# Patient Record
Sex: Male | Born: 1960 | Race: Black or African American | Hispanic: No | Marital: Married | Smoking: Never smoker
Health system: Southern US, Community
[De-identification: ages and names within clinical notes are randomized; demographics above are authoritative.]

## PROBLEM LIST (undated history)

## (undated) DIAGNOSIS — I10 Essential (primary) hypertension: Secondary | ICD-10-CM

## (undated) DIAGNOSIS — M1990 Unspecified osteoarthritis, unspecified site: Secondary | ICD-10-CM

## (undated) HISTORY — PX: MOUTH SURGERY: SHX715

## (undated) HISTORY — DX: Essential (primary) hypertension: I10

---

## 1997-08-10 ENCOUNTER — Emergency Department (HOSPITAL_COMMUNITY): Admission: EM | Admit: 1997-08-10 | Discharge: 1997-08-10 | Payer: Self-pay | Admitting: Emergency Medicine

## 1999-08-05 ENCOUNTER — Encounter: Admission: RE | Admit: 1999-08-05 | Discharge: 1999-08-05 | Payer: Self-pay | Admitting: Family Medicine

## 1999-09-24 ENCOUNTER — Encounter: Admission: RE | Admit: 1999-09-24 | Discharge: 1999-09-24 | Payer: Self-pay | Admitting: Family Medicine

## 2003-04-04 ENCOUNTER — Encounter: Admission: RE | Admit: 2003-04-04 | Discharge: 2003-04-04 | Payer: Self-pay | Admitting: Family Medicine

## 2005-09-29 ENCOUNTER — Emergency Department (HOSPITAL_COMMUNITY): Admission: EM | Admit: 2005-09-29 | Discharge: 2005-09-29 | Payer: Self-pay | Admitting: Family Medicine

## 2006-06-06 ENCOUNTER — Emergency Department (HOSPITAL_COMMUNITY): Admission: EM | Admit: 2006-06-06 | Discharge: 2006-06-06 | Payer: Self-pay | Admitting: Family Medicine

## 2006-09-21 ENCOUNTER — Ambulatory Visit: Payer: Self-pay | Admitting: Internal Medicine

## 2006-09-21 ENCOUNTER — Ambulatory Visit: Payer: Self-pay | Admitting: *Deleted

## 2006-09-21 LAB — CONVERTED CEMR LAB
ALT: 21 units/L (ref 0–53)
Albumin: 4.7 g/dL (ref 3.5–5.2)
CO2: 26 meq/L (ref 19–32)
Calcium: 9.6 mg/dL (ref 8.4–10.5)
Chloride: 104 meq/L (ref 96–112)
Eosinophils Relative: 3 % (ref 0–5)
Glucose, Bld: 88 mg/dL (ref 70–99)
HCT: 47.2 % (ref 39.0–52.0)
Helicobacter Pylori Antibody-IgG: 3.5 — ABNORMAL HIGH
Hemoglobin: 16.2 g/dL (ref 13.0–17.0)
Lymphocytes Relative: 49 % — ABNORMAL HIGH (ref 12–46)
Lymphs Abs: 2.6 10*3/uL (ref 0.7–3.3)
Neutro Abs: 2 10*3/uL (ref 1.7–7.7)
Platelets: 309 10*3/uL (ref 150–400)
Sodium: 143 meq/L (ref 135–145)
Total Bilirubin: 1.1 mg/dL (ref 0.3–1.2)
Total Protein: 8.1 g/dL (ref 6.0–8.3)
WBC: 5.4 10*3/uL (ref 4.0–10.5)

## 2007-03-08 ENCOUNTER — Ambulatory Visit: Payer: Self-pay | Admitting: Internal Medicine

## 2007-04-04 ENCOUNTER — Ambulatory Visit: Payer: Self-pay | Admitting: Internal Medicine

## 2007-04-04 LAB — CONVERTED CEMR LAB
Cholesterol: 139 mg/dL (ref 0–200)
Total CHOL/HDL Ratio: 4.2

## 2007-04-10 ENCOUNTER — Ambulatory Visit: Payer: Self-pay | Admitting: Internal Medicine

## 2007-05-08 ENCOUNTER — Ambulatory Visit: Payer: Self-pay | Admitting: Internal Medicine

## 2007-08-22 ENCOUNTER — Ambulatory Visit: Payer: Self-pay | Admitting: Family Medicine

## 2007-08-23 ENCOUNTER — Ambulatory Visit (HOSPITAL_COMMUNITY): Admission: RE | Admit: 2007-08-23 | Discharge: 2007-08-23 | Payer: Self-pay | Admitting: Family Medicine

## 2008-04-01 ENCOUNTER — Emergency Department (HOSPITAL_COMMUNITY): Admission: EM | Admit: 2008-04-01 | Discharge: 2008-04-01 | Payer: Self-pay | Admitting: Family Medicine

## 2008-10-29 ENCOUNTER — Emergency Department (HOSPITAL_COMMUNITY): Admission: EM | Admit: 2008-10-29 | Discharge: 2008-10-29 | Payer: Self-pay | Admitting: Emergency Medicine

## 2009-02-13 ENCOUNTER — Encounter (INDEPENDENT_AMBULATORY_CARE_PROVIDER_SITE_OTHER): Payer: Self-pay | Admitting: Adult Health

## 2009-02-13 ENCOUNTER — Ambulatory Visit: Payer: Self-pay | Admitting: Internal Medicine

## 2009-02-13 LAB — CONVERTED CEMR LAB
Leukocytes, UA: NEGATIVE
Nitrite: NEGATIVE
PSA: 1.11 ng/mL (ref 0.10–4.00)
Specific Gravity, Urine: 1.024 (ref 1.005–1.030)
Urobilinogen, UA: 0.2 (ref 0.0–1.0)
pH: 5.5 (ref 5.0–8.0)

## 2009-10-21 ENCOUNTER — Emergency Department (HOSPITAL_COMMUNITY): Admission: EM | Admit: 2009-10-21 | Discharge: 2009-10-21 | Payer: Self-pay | Admitting: Family Medicine

## 2010-05-20 ENCOUNTER — Ambulatory Visit (HOSPITAL_COMMUNITY)
Admission: RE | Admit: 2010-05-20 | Discharge: 2010-05-20 | Disposition: A | Discharge status: Home / Self Care | Payer: Self-pay | Source: Ambulatory Visit | Attending: Gastroenterology | Admitting: Gastroenterology

## 2010-05-20 ENCOUNTER — Other Ambulatory Visit: Payer: Self-pay | Admitting: Gastroenterology

## 2010-05-20 DIAGNOSIS — R197 Diarrhea, unspecified: Secondary | ICD-10-CM | POA: Insufficient documentation

## 2010-05-20 DIAGNOSIS — K644 Residual hemorrhoidal skin tags: Secondary | ICD-10-CM | POA: Insufficient documentation

## 2010-05-20 DIAGNOSIS — D126 Benign neoplasm of colon, unspecified: Secondary | ICD-10-CM | POA: Insufficient documentation

## 2010-10-22 LAB — POCT URINALYSIS DIP (DEVICE)
Bilirubin Urine: NEGATIVE
Glucose, UA: NEGATIVE
Operator id: 235561
Urobilinogen, UA: 0.2

## 2010-12-11 ENCOUNTER — Emergency Department (HOSPITAL_COMMUNITY): Payer: No Typology Code available for payment source

## 2010-12-11 ENCOUNTER — Emergency Department (HOSPITAL_COMMUNITY)
Admission: EM | Admit: 2010-12-11 | Discharge: 2010-12-12 | Disposition: A | Discharge status: Home / Self Care | Payer: No Typology Code available for payment source | Attending: Emergency Medicine | Admitting: Emergency Medicine

## 2010-12-11 ENCOUNTER — Encounter: Payer: Self-pay | Admitting: *Deleted

## 2010-12-11 DIAGNOSIS — IMO0001 Reserved for inherently not codable concepts without codable children: Secondary | ICD-10-CM | POA: Insufficient documentation

## 2010-12-11 DIAGNOSIS — IMO0002 Reserved for concepts with insufficient information to code with codable children: Secondary | ICD-10-CM

## 2010-12-11 DIAGNOSIS — M2550 Pain in unspecified joint: Secondary | ICD-10-CM | POA: Insufficient documentation

## 2010-12-11 DIAGNOSIS — R109 Unspecified abdominal pain: Secondary | ICD-10-CM | POA: Insufficient documentation

## 2010-12-11 DIAGNOSIS — M545 Low back pain, unspecified: Secondary | ICD-10-CM | POA: Insufficient documentation

## 2010-12-11 DIAGNOSIS — M79609 Pain in unspecified limb: Secondary | ICD-10-CM | POA: Insufficient documentation

## 2010-12-11 DIAGNOSIS — S61409A Unspecified open wound of unspecified hand, initial encounter: Secondary | ICD-10-CM | POA: Insufficient documentation

## 2010-12-11 DIAGNOSIS — Y92410 Unspecified street and highway as the place of occurrence of the external cause: Secondary | ICD-10-CM | POA: Insufficient documentation

## 2010-12-11 LAB — POCT I-STAT, CHEM 8
BUN: 10 mg/dL (ref 6–23)
Calcium, Ion: 1.07 mmol/L — ABNORMAL LOW (ref 1.12–1.32)
Creatinine, Ser: 0.9 mg/dL (ref 0.50–1.35)
Sodium: 140 mEq/L (ref 135–145)
TCO2: 25 mmol/L (ref 0–100)

## 2010-12-11 MED ORDER — TETANUS-DIPHTH-ACELL PERTUSSIS 5-2.5-18.5 LF-MCG/0.5 IM SUSP
0.5000 mL | Freq: Once | INTRAMUSCULAR | Status: AC
  Administered 2010-12-11: 0.5 mL via INTRAMUSCULAR
  Filled 2010-12-11: qty 0.5

## 2010-12-11 MED ORDER — FENTANYL CITRATE 0.05 MG/ML IJ SOLN
50.0000 ug | Freq: Once | INTRAMUSCULAR | Status: AC
  Administered 2010-12-11: 50 ug via INTRAVENOUS
  Filled 2010-12-11: qty 2
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
## 2010-12-12 ENCOUNTER — Emergency Department (HOSPITAL_COMMUNITY): Payer: No Typology Code available for payment source

## 2010-12-12 MED ORDER — IBUPROFEN 800 MG PO TABS: 800.0000 mg | ORAL_TABLET | Freq: Three times a day (TID) | ORAL | Status: AC

## 2010-12-12 MED ORDER — IOHEXOL 300 MG/ML  SOLN
100.0000 mL | Freq: Once | INTRAMUSCULAR | Status: AC | PRN
  Administered 2010-12-12: 100 mL via INTRAVENOUS

## 2010-12-12 NOTE — ED Notes (Signed)
D/c instructions reviewed w/ pt - pt denies any further questions or concerns at present.   

## 2010-12-12 NOTE — ED Provider Notes (Signed)
LACERATION REPAIR Performed by: Rise Patience Consent: Verbal consent obtained. Risks and benefits: risks, benefits and alternatives were discussed Patient identity confirmed: provided demographic data Time out performed prior to procedure Prepped and Draped in normal sterile fashion Wound explored  Laceration Location: left dorsal hand, 5th MCP  Laceration Length: 1.5cm  No Foreign Bodies seen or palpated  Anesthesia: local infiltration  Local anesthetic: lidocaine 2% no epinephrine  Irrigation method: syringe Amount of cleaning: standard  Skin closure: 4-0 prolene Number of sutures or staples: 3  Technique: simple interrupted.  Patient tolerance: Patient tolerated the procedure well with no immediate complications.   Matthew Stark, [place] 12/12/10 [phone]

## 2010-12-12 NOTE — ED Provider Notes (Signed)
Medical screening examination/treatment/procedure(s) were performed by non-physician practitioner and as supervising physician I was immediately available for consultation/collaboration.   Abe Schools, MD 12/12/10 1552 

## 2010-12-22 ENCOUNTER — Encounter (HOSPITAL_COMMUNITY): Payer: Self-pay | Admitting: Emergency Medicine

## 2010-12-22 ENCOUNTER — Emergency Department (INDEPENDENT_AMBULATORY_CARE_PROVIDER_SITE_OTHER)
Admission: EM | Admit: 2010-12-22 | Discharge: 2010-12-22 | Disposition: A | Discharge status: Home / Self Care | Payer: Self-pay | Source: Home / Self Care | Attending: Family Medicine | Admitting: Family Medicine

## 2010-12-22 DIAGNOSIS — Z4802 Encounter for removal of sutures: Secondary | ICD-10-CM

## 2010-12-22 NOTE — ED Notes (Signed)
Suture removal from left hand/little finger.  Placed in mced on 12/11/2010

## 2010-12-22 NOTE — ED Provider Notes (Signed)
History     CSN: [id number] Arrival date & time: 12/22/2010  3:01 PM   First MD Initiated Contact with Patient 12/22/10 1508      Chief Complaint  Patient presents with  . Suture / Staple Removal    (Consider location/radiation/quality/duration/timing/severity/associated sxs/prior treatment) Patient is a 49 y.o. male presenting with suture removal. The history is provided by the patient.  Suture / Staple Removal  The sutures were placed 7 to 10 days ago. There has been no treatment since the wound repair. There has been no drainage from the wound. There is no redness present. There is no swelling present. The pain has no pain. He has no difficulty moving the affected extremity or digit.    History reviewed. No pertinent past medical history.  History reviewed. No pertinent past surgical history.  No family history on file.  History  Substance Use Topics  . Smoking status: Never Smoker   . Smokeless tobacco: Not on file  . Alcohol Use: No      Review of Systems  Constitutional: Negative.   Skin: Positive for wound.    Allergies  Review of patient's allergies indicates no known allergies.  Home Medications   Current Outpatient Rx  Name Route Sig Dispense Refill  . IBUPROFEN 800 MG PO TABS Oral Take 1 tablet (800 mg total) by mouth 3 (three) times daily. 21 tablet 0    BP 156/81  Pulse 70  Temp(Src) 98.7 F (37.1 C) (Oral)  Resp 18  SpO2 100%  Physical Exam  Nursing note and vitals reviewed. Constitutional: He appears well-developed and well-nourished.  Skin: Skin is warm and dry.       ED Course  Procedures (including critical care time)  Labs Reviewed - No data to display No results found.   1. Encounter for removal of sutures       MDM  3 sutures removed.        Barkley Bruns, MD 12/22/10 [phone]

## 2011-06-16 ENCOUNTER — Encounter (HOSPITAL_COMMUNITY): Payer: Self-pay | Admitting: *Deleted

## 2011-06-16 ENCOUNTER — Emergency Department (HOSPITAL_COMMUNITY)
Admission: EM | Admit: 2011-06-16 | Discharge: 2011-06-16 | Disposition: A | Discharge status: Home / Self Care | Payer: Self-pay | Source: Home / Self Care | Attending: Emergency Medicine | Admitting: Emergency Medicine

## 2011-06-16 DIAGNOSIS — L0390 Cellulitis, unspecified: Secondary | ICD-10-CM

## 2011-06-16 DIAGNOSIS — T148XXA Other injury of unspecified body region, initial encounter: Secondary | ICD-10-CM

## 2011-06-16 DIAGNOSIS — W57XXXA Bitten or stung by nonvenomous insect and other nonvenomous arthropods, initial encounter: Secondary | ICD-10-CM

## 2011-06-16 MED ORDER — DOXYCYCLINE HYCLATE 100 MG PO TABS: 100.0000 mg | ORAL_TABLET | Freq: Two times a day (BID) | ORAL | Status: AC

## 2011-06-16 MED ORDER — MUPIROCIN 2 % EX OINT: TOPICAL_OINTMENT | Freq: Three times a day (TID) | CUTANEOUS | Status: AC

## 2011-06-16 NOTE — Discharge Instructions (Signed)
Wood Tick Bite Ticks are insects that attach themselves to the skin. Most tick bites are harmless, but sometimes ticks carry diseases that can make a person quite ill. The chance of getting ill depends on:  The kind of tick that bites you.   Time of year.   How long the tick is attached.   Geographic location.  Wood ticks are also called dog ticks. They are generally black. They can have white markings. They live in shrubs and grassy areas. They are larger than deer ticks. Wood ticks are about the size of a watermelon seed. They have a hard body. The most common places for ticks to attach themselves are the scalp, neck, armpits, waist, and groin. Wood ticks may stay attached for up to 2 weeks. TICKS MUST BE REMOVED AS SOON AS POSSIBLE TO HELP PREVENT DISEASES CAUSED BY TICK BITES.  TO REMOVE A TICK: 1. If available, put on latex gloves before trying to remove a tick.  2. Grasp the tick as close to the skin as possible, with curved forceps, fine tweezers or a special tick removal tool.  3. Pull gently with steady pressure until the tick lets go. Do not twist the tick or jerk it suddenly. This may break off the tick's head or mouth parts.  4. Do not crush the tick's body. This could force disease-carrying fluids from the tick into your body.  5. After the tick is removed, wash the bite area and your hands with soap and water or other disinfectant.  6. Apply a small amount of antiseptic cream or ointment to the bite site.  7. Wash and disinfect any instruments that were used.  8. Save the tick in a jar or plastic bag for later identification. Preserve the tick with a bit of alcohol or put it in the freezer.  9. Do not apply a hot match, petroleum jelly, or fingernail polish to the tick. This does not work and may increase the chances of disease from the tick bite.  YOU MAY NEED TO SEE YOUR CAREGIVER FOR A TETANUS SHOT NOW IF:  You have no idea when you had the last one.   You have never had a  tetanus shot before.  If you need a tetanus shot, and you decide not to get one, there is a rare chance of getting tetanus. Sickness from tetanus can be serious. If you get a tetanus shot, your arm may swell, get red and warm to the touch at the shot site. This is common and not a problem. PREVENTION  Wear protective clothing. Long sleeves and pants are best.   Wear white clothes to see ticks more easily   Tuck your pant legs into your socks.   If walking on trail, stay in the middle of the trail to avoid brushing against bushes.   Put insect repellent on all exposed skin and along boot tops, pant legs and sleeve cuffs   Check clothing, hair and skin repeatedly and before coming inside.   Brush off any ticks that are not attached.  SEEK MEDICAL CARE IF:   You cannot remove a tick or part of the tick that is left in the skin.   Unexplained fever.   Redness and swelling in the area of the tick bite.   Tender, swollen lymph glands.   Diarrhea.   Weight loss.   Cough.   Fatigue.   Muscle, joint or bone pain.   Belly pain.   Headache.   Rash.    SEEK IMMEDIATE MEDICAL CARE IF:   You develop an oral temperature above 102 F (38.9 C).   You are having trouble walking or moving your legs.   Numbness in the legs.   Shortness of breath.   Confusion.   Repeated vomiting.  Document Released: 12/19/1999 Document Revised: 12/10/2010 Document Reviewed: 11/27/2007 [employer] Patient Information 2012 [employer]. 

## 2011-06-16 NOTE — ED Notes (Signed)
Pt reports a painful, reddened circular area, approx 4mm, on his right inguinal area.  He noticed this 4 days ago

## 2011-06-16 NOTE — ED Provider Notes (Signed)
Chief Complaint  Patient presents with  . Cellulitis    History of Present Illness:   The patient is a 51 year old [place] male who noted a bump in his right groin 4 days ago and this has had some surrounding erythema, swelling, and pain to palpation since that. He denies any fever, chills, headaches, muscle aches, or skin rash elsewhere.  Review of Systems:  Other than noted above, the patient denies any of the following symptoms: Systemic:  No fever, chills, sweats, weight loss, or fatigue. ENT:  No nasal congestion, rhinorrhea, sore throat, swelling of lips, tongue or throat. Resp:  No cough, wheezing, or shortness of breath. Skin:  No rash, itching, nodules, or suspicious lesions.  PMFSH:  Past medical history, family history, social history, meds, and allergies were reviewed.  Physical Exam:   Vital signs:  BP 130/79  Pulse 86  Temp 98.2 F (36.8 C) (Oral)  Resp 18  SpO2 98% Gen:  Alert, oriented, in no distress. ENT:  Pharynx clear, no intraoral lesions, moist mucous membranes. Lungs:  Clear to auscultation. Skin:  He has an engorged tick attached to his right groin area with a 6 cm area of surrounding erythema, heat, induration, and tenderness to palpation. His skin was otherwise clear.  Procedure Note:  Verbal informed consent was obtained from the patient.  Risks and benefits were outlined with the patient.  Patient understands and accepts these risks.  Identity of the patient was confirmed verbally and by armband.    Procedure was performed as followed:  The tick in the area surrounding it was prepped with alcohol, 1 cc of 2% Xylocaine without epinephrine was injected around the site of the tick bite, and the tick was removed with gentle traction. The tick appeared to be intact, and there were no obvious retained mouth parts.  Patient tolerated the procedure well without any immediate complications.   Assessment:  The primary encounter diagnosis was Tick bite. A diagnosis  of Cellulitis was also pertinent to this visit.  Plan:   1.  The following meds were prescribed:   New Prescriptions   DOXYCYCLINE (VIBRA-TABS) 100 MG TABLET    Take 1 tablet (100 mg total) by mouth 2 (two) times daily.   MUPIROCIN OINTMENT (BACTROBAN) 2 %    Apply topically 3 (three) times daily.   2.  The patient was instructed in symptomatic care and handouts were given. 3.  The patient was told to return if becoming worse in any way, if no better in 3 or 4 days, and given some red flag symptoms that would indicate earlier return. The patient was instructed in wound care and will be on the lookout for any symptoms of systemic illness, especially with fever, chills, headache, photophobia, myalgias, or generalized rash.     Reuben Likes, MD 06/16/11 1320
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
## 2013-01-17 ENCOUNTER — Emergency Department (HOSPITAL_COMMUNITY)
Admission: EM | Admit: 2013-01-17 | Discharge: 2013-01-17 | Disposition: A | Discharge status: Home / Self Care | Payer: Medicaid Other | Source: Home / Self Care | Attending: Emergency Medicine | Admitting: Emergency Medicine

## 2013-01-17 ENCOUNTER — Encounter (HOSPITAL_COMMUNITY): Payer: Self-pay | Admitting: Emergency Medicine

## 2013-01-17 DIAGNOSIS — Z0000 Encounter for general adult medical examination without abnormal findings: Secondary | ICD-10-CM

## 2013-01-17 LAB — POCT URINALYSIS DIP (DEVICE)
Bilirubin Urine: NEGATIVE
GLUCOSE, UA: NEGATIVE mg/dL
KETONES UR: NEGATIVE mg/dL
LEUKOCYTES UA: NEGATIVE
Nitrite: NEGATIVE
PROTEIN: NEGATIVE mg/dL
UROBILINOGEN UA: 0.2 mg/dL (ref 0.0–1.0)
pH: 6 (ref 5.0–8.0)

## 2013-01-17 NOTE — ED Provider Notes (Signed)
Medical screening examination/treatment/procedure(s) were performed by non-physician practitioner and as supervising physician I was immediately available for consultation/collaboration.  Philipp Deputy, M.D.  Harden Mo, MD 01/17/13 2100

## 2013-01-17 NOTE — ED Notes (Signed)
C/o prostates check since he is having pain for two weeks

## 2013-01-17 NOTE — ED Provider Notes (Signed)
CSN: [id number]     Arrival date & time 01/17/13  1641 History   First MD Initiated Contact with Patient 01/17/13 1837     Chief Complaint  Patient presents with  . Prostate Check   (Consider location/radiation/quality/duration/timing/severity/associated sxs/prior Treatment) HPI Comments: Patient presents requesting screening for prostate cancer. He reports that he did contact his PCP ([place])  regarding this issue and was given an appointment for mid-February 2015. He decided he did not want to wait this long and then chose to come to the UC for evaluation. Denies difficulty with penile discharge, dysuria, hematuria, genital lesions, genital or testicular swelling, back pain, frequent nighttime urination, difficulty emptying bladder, flank pain or abdominal pain.   The history is provided by the patient. The history is limited by a language barrier. No language interpreter was used.    History reviewed. No pertinent past medical history. History reviewed. No pertinent past surgical history. History reviewed. No pertinent family history. History  Substance Use Topics  . Smoking status: Never Smoker   . Smokeless tobacco: Not on file  . Alcohol Use: No    Review of Systems  All other systems reviewed and are negative.    Allergies  Review of patient's allergies indicates no known allergies.  Home Medications  No current outpatient prescriptions on file. BP 139/79  Pulse 73  Temp(Src) 98.4 F (36.9 C) (Oral)  Resp 16  SpO2 96% Physical Exam  Nursing note and vitals reviewed. Constitutional: He is oriented to person, place, and time. He appears well-developed and well-nourished.  HENT:  Head: Normocephalic and atraumatic.  Eyes: Conjunctivae are normal. No scleral icterus.  Neck: Normal range of motion. Neck supple.  Cardiovascular: Normal rate, regular rhythm and normal heart sounds.   Pulmonary/Chest: Effort normal and breath sounds normal. No respiratory  distress.  Abdominal: Soft. Bowel sounds are normal. He exhibits no distension and no mass. There is no tenderness. There is no guarding. Hernia confirmed negative in the right inguinal area and confirmed negative in the left inguinal area.  Genitourinary: Rectum normal, prostate normal, testes normal and penis normal. Rectal exam shows no external hemorrhoid, no internal hemorrhoid, no mass and no tenderness. Prostate is not enlarged and not tender. Right testis shows no mass, no swelling and no tenderness. Left testis shows no mass, no swelling and no tenderness. Circumcised.  Musculoskeletal: Normal range of motion.  Lymphadenopathy:       Right: No inguinal adenopathy present.       Left: No inguinal adenopathy present.  Neurological: He is alert and oriented to person, place, and time.  Skin: Skin is warm and dry.  Psychiatric: He has a normal mood and affect. His behavior is normal.    ED Course  Procedures (including critical care time) Labs Review Labs Reviewed - No data to display Imaging Review No results found.  EKG Interpretation    Date/Time:    Ventricular Rate:    PR Interval:    QRS Duration:   QT Interval:    QTC Calculation:   R Axis:     Text Interpretation:              MDM  Advised patient that should he wish to be evaluated for prostate cancer, which seems to be his main concern tonight, this would involve both a visit with his PCP and possibly a urologist as well. Encouraged him to keep his Feb. 2015 appointment with his PCP to discuss. UA normal. Exam unremarkable.  [place], [place] 01/17/13 [phone]

## 2013-01-17 NOTE — Discharge Instructions (Signed)
Normal Exam, Adult You were seen and examined today in our facility. Our caregiver found nothing wrong on the exam. If testing was done such as lab work or x-rays, they did not indicate enough wrong to suggest that treatment should be given. The caregiver then must decide after testing is finished if your concern is a physical problem or illness that needs treatment. Today no treatable problem was found. Even if reassurance was given, if you feel you are getting worse when you get home make sure you call back or return to our department. For the protection of your privacy, test results can not be given over the phone. Make sure you receive the results of your test. Ask as to how these results are to be obtained if you have not been informed. It is your responsibility to obtain your test results. Your condition can change over time. Sometimes it takes more than one visit to determine the cause of your symptoms. It is important that you monitor your condition for any changes. SEEK IMMEDIATE MEDICAL CARE IF:  You develop an oral temperature above 102 F (38.9 C), which lasts for more than 2 days, not controlled by medications. Only take over-the-counter or prescription medicines for pain, discomfort, or fever as directed by your caregiver.  You develop a loss of appetite or start throwing up (vomiting).  You develop a rash, cough, belly (abdominal) pain, earache, headache, or develop pain in neck, muscles, or joints.  The problem or problems which brought you to our facility become worse or are a cause of more concern. If we have told you today your exam and tests are normal, and a short while later you feel this is not right, please seek medical attention so you may be rechecked. Document Released: 04/04/2000 Document Revised: 03/15/2011 Document Reviewed: 07/28/2007 [facility] Patient Information 2014 [place], [place].   Your urine studies were normal.should you wish to be evaluated for prostate  cancer, which seems to be your main concern tonight, this would involve both a visit with your doctor at [facility] and possibly a urologist as well. Please keep your Feb. 2015 appointment with your doctor to discuss.

## 2014-05-17 ENCOUNTER — Emergency Department (HOSPITAL_COMMUNITY)
Admission: EM | Admit: 2014-05-17 | Discharge: 2014-05-17 | Disposition: A | Discharge status: Home / Self Care | Payer: Medicaid Other | Source: Home / Self Care | Attending: Family Medicine | Admitting: Family Medicine

## 2014-05-17 ENCOUNTER — Encounter (HOSPITAL_COMMUNITY): Payer: Self-pay | Admitting: Emergency Medicine

## 2014-05-17 DIAGNOSIS — N451 Epididymitis: Secondary | ICD-10-CM

## 2014-05-17 LAB — POCT URINALYSIS DIP (DEVICE)
Bilirubin Urine: NEGATIVE
GLUCOSE, UA: NEGATIVE mg/dL
KETONES UR: NEGATIVE mg/dL
Leukocytes, UA: NEGATIVE
Nitrite: NEGATIVE
PROTEIN: NEGATIVE mg/dL
SPECIFIC GRAVITY, URINE: 1.02 (ref 1.005–1.030)
Urobilinogen, UA: 0.2 mg/dL (ref 0.0–1.0)
pH: 7 (ref 5.0–8.0)

## 2014-05-17 MED ORDER — DOXYCYCLINE HYCLATE 100 MG PO CAPS: 100.0000 mg | ORAL_CAPSULE | Freq: Two times a day (BID) | ORAL | Status: DC

## 2014-05-17 NOTE — ED Notes (Signed)
C/o intermittent groin pain onset 5/6; believes it maybe prostate Denies urinary sx, abd pain, fevers, chills Alert, no signs of acute distress.

## 2014-05-17 NOTE — ED Provider Notes (Signed)
CSN: [id number]     Arrival date & time 05/17/14  1435 History   First MD Initiated Contact with Patient 05/17/14 1521     Chief Complaint  Patient presents with  . Groin Pain   (Consider location/radiation/quality/duration/timing/severity/associated sxs/prior Treatment) HPI Comments: 54 year old male is complaining of a pinching type pain in the perineum. It is primarily exacerbated or experienced when walking with the act of sitting down or standing up. He does not notice it as much when at rest. He denies dysuria, urinary frequency or urgency. In the remote past he remembers having something similar and had a small amount of blood in his urine. At that time he was treated with an unknown type medication and he got better.   History reviewed. No pertinent past medical history. History reviewed. No pertinent past surgical history. No family history on file. History  Substance Use Topics  . Smoking status: Never Smoker   . Smokeless tobacco: Not on file  . Alcohol Use: No    Review of Systems  Constitutional: Negative.   Respiratory: Negative.   Genitourinary: Negative for dysuria, urgency, frequency, hematuria, flank pain, decreased urine volume, discharge, penile swelling, scrotal swelling, genital sores and penile pain.  Skin: Negative.   Neurological: Negative.   Psychiatric/Behavioral: Negative.     Allergies  Review of patient's allergies indicates no known allergies.  Home Medications   Prior to Admission medications   Medication Sig Start Date End Date Taking? Authorizing Provider  doxycycline (VIBRAMYCIN) 100 MG capsule Take 1 capsule (100 mg total) by mouth 2 (two) times daily. 05/17/14   Janne Napoleon, NP   BP 134/80 mmHg  Pulse 75  Temp(Src) 97.9 F (36.6 C) (Oral)  Resp 16  SpO2 95% Physical Exam  Constitutional: He appears well-developed and well-nourished. No distress.  Pulmonary/Chest: Effort normal. No respiratory distress.  Genitourinary: Rectum normal  and penis normal. No penile tenderness.  Normal external male genitalia. Bilateral testicles are descended. There are normal size shape and position. No tenderness. Palpation of the epididymal apparatus reveals no pain or tenderness. Palpation of the perineum just posterior to the scrotum reveals minimal discomfort.  DRE: Normal sphincter tone. No external or internal or external lesions. Prostate is firm and smooth and not enlarged and nontender. No stool in the rectal vault. No other lesions palpated.  Neurological: He is alert. He exhibits normal muscle tone.  Skin: Skin is warm and dry.  Psychiatric: He has a normal mood and affect.  Nursing note and vitals reviewed.   ED Course  Procedures (including critical care time) Labs Review Labs Reviewed  POCT URINALYSIS DIP (DEVICE) - Abnormal; Notable for the following:    Hgb urine dipstick TRACE (*)    All other components within normal limits   Results for orders placed or performed during the hospital encounter of 05/17/14  POCT urinalysis dip (device)  Result Value Ref Range   Glucose, UA NEGATIVE NEGATIVE mg/dL   Bilirubin Urine NEGATIVE NEGATIVE   Ketones, ur NEGATIVE NEGATIVE mg/dL   Specific Gravity, Urine 1.020 1.005 - 1.030   Hgb urine dipstick TRACE (A) NEGATIVE   pH 7.0 5.0 - 8.0   Protein, ur NEGATIVE NEGATIVE mg/dL   Urobilinogen, UA 0.2 0.0 - 1.0 mg/dL   Nitrite NEGATIVE NEGATIVE   Leukocytes, UA NEGATIVE NEGATIVE    Imaging Review No results found.   MDM   1. Epididymitis    Doxy 100 bid F/U with Merlyn Albert, NP 05/17/14 1544

## 2014-05-17 NOTE — Discharge Instructions (Signed)
Possible Epididymitis Epididymitis is a swelling (inflammation) of the epididymis. The epididymis is a cord-like structure along the back part of the testicle. Epididymitis is usually, but not always, caused by infection. This is usually a sudden problem beginning with chills, fever and pain behind the scrotum and in the testicle. There may be swelling and redness of the testicle. DIAGNOSIS  Physical examination will reveal a tender, swollen epididymis. Sometimes, cultures are obtained from the urine or from prostate secretions to help find out if there is an infection or if the cause is a different problem. Sometimes, blood work is performed to see if your white blood cell count is elevated and if a germ (bacterial) or viral infection is present. Using this knowledge, an appropriate medicine which kills germs (antibiotic) can be chosen by your caregiver. A viral infection causing epididymitis will most often go away (resolve) without treatment. HOME CARE INSTRUCTIONS   Hot sitz baths for 20 minutes, 4 times per day, may help relieve pain.  Only take over-the-counter or prescription medicines for pain, discomfort or fever as directed by your caregiver.  Take all medicines, including antibiotics, as directed. Take the antibiotics for the full prescribed length of time even if you are feeling better.  It is very important to keep all follow-up appointments. SEEK IMMEDIATE MEDICAL CARE IF:   You have a fever.  You have pain not relieved with medicines.  You have any worsening of your problems.  Your pain seems to come and go.  You develop pain, redness, and swelling in the scrotum and surrounding areas. MAKE SURE YOU:   Understand these instructions.  Will watch your condition.  Will get help right away if you are not doing well or get worse. Document Released: 12/19/1999 Document Revised: 03/15/2011 Document Reviewed: 11/07/2008 [facility] Patient Information 2015 [place], [place]. This  information is not intended to replace advice given to you by your health care provider. Make sure you discuss any questions you have with your health care provider.

## 2018-08-14 ENCOUNTER — Other Ambulatory Visit: Payer: Self-pay

## 2018-08-14 DIAGNOSIS — Z20822 Contact with and (suspected) exposure to covid-19: Secondary | ICD-10-CM

## 2020-10-17 ENCOUNTER — Ambulatory Visit: Payer: Medicaid Other | Attending: Orthopedic Surgery | Admitting: Physical Therapy

## 2020-10-17 ENCOUNTER — Encounter: Payer: Self-pay | Admitting: Physical Therapy

## 2020-10-17 ENCOUNTER — Other Ambulatory Visit: Payer: Self-pay

## 2020-10-17 DIAGNOSIS — G8929 Other chronic pain: Secondary | ICD-10-CM | POA: Diagnosis present

## 2020-10-17 DIAGNOSIS — R2689 Other abnormalities of gait and mobility: Secondary | ICD-10-CM | POA: Diagnosis present

## 2020-10-17 DIAGNOSIS — M25561 Pain in right knee: Secondary | ICD-10-CM | POA: Diagnosis not present

## 2020-10-17 DIAGNOSIS — M25661 Stiffness of right knee, not elsewhere classified: Secondary | ICD-10-CM

## 2020-10-17 DIAGNOSIS — M6281 Muscle weakness (generalized): Secondary | ICD-10-CM | POA: Diagnosis present

## 2020-10-17 NOTE — Patient Instructions (Signed)
Access Code: [email or web address] URL: [email or web address] Date: 10/17/2020 Prepared by: Hilda Blades  Exercises Supine Quad Set - 1 x daily - 7 x weekly - 2 sets - 10 reps - 5 hold Active Straight Leg Raise with Quad Set - 1 x daily - 7 x weekly - 2 sets - 10 reps Supine Bridge - 1 x daily - 7 x weekly - 2 sets - 10 reps - 5 hold Sidelying Hip Abduction - 1 x daily - 7 x weekly - 2 sets - 10 reps Seated Hamstring Stretch - 2 x daily - 7 x weekly - 3 reps - 30 seconds hold Standing Gastroc Stretch at Counter - 2 x daily - 7 x weekly - 3 reps - 30 seconds hold

## 2020-10-17 NOTE — Therapy (Signed)
[place], [place], [address] Phone: [phone]   Fax:  [phone]  Physical Therapy Evaluation  Patient Details  Name: Matthew Stark MRN: [id number] Date of Birth: [birth year] Referring Provider (PT): Willaim Sheng, MD   Encounter Date: 10/17/2020   PT End of Session - 10/17/20 0823     Visit Number 1    Number of Visits 8    Date for PT Re-Evaluation 12/12/20    Authorization Type [facility] [employer]    PT Start Time 0825    PT Stop Time 0910    PT Time Calculation (min) 45 min    Activity Tolerance Patient tolerated treatment well    Behavior During Therapy [facility] for tasks assessed/performed             History reviewed. No pertinent past medical history.  History reviewed. No pertinent surgical history.  There were no vitals filed for this visit.    Subjective Assessment - 10/17/20 0825     Subjective Patient reports pain in right knee. Has been going on for a long time, can't remember a specific incident but has been getting slowly worse. He rpeorts that he has trouble walking and squatting. Pain is mainly located to the front and inside of the knee. Patient also reports trouble kneeling on the floor. He has a two story home and has to go up and down the stairs slowly.    Limitations Lifting;Standing;Walking;House hold activities;Sitting    How long can you sit comfortably? "only hurts when getting up after sitting long periods"    How long can you walk comfortably? "all walking is painful"    Diagnostic tests X-ray and MRI - not available    Patient Stated Goals Pain relief and improve walking    Currently in Pain? Yes    Pain Score 0-No pain   states 10/10 while walking   Pain Location Knee    Pain Orientation Right    Pain Descriptors / Indicators Aching;Sore    Pain Type Chronic pain    Pain Onset More than a month ago    Pain Frequency Intermittent    Aggravating Factors   Walking, standing, squatting, kneeling    Pain Relieving Factors Rest, sitting, medication                [facility] PT Assessment - 10/17/20 0001       Assessment   Medical Diagnosis Right Knee OA    Referring Provider (PT) Willaim Sheng, MD    Onset Date/Surgical Date --   ongoing 1+ years   Prior Therapy No      Precautions   Precautions None      Restrictions   Weight Bearing Restrictions No      Balance Screen   Has the patient fallen in the past 6 months No    Has the patient had a decrease in activity level because of a fear of falling?  No    Is the patient reluctant to leave their home because of a fear of falling?  No      Home Environment   Living Environment Private residence    Type of [place] Access Level entry    Home Layout Two level    Alternate Level Stairs-Number of Steps 1 flght of stairs      Prior Function   Level of Independence Independent    Vocation Full time employment  Vocation Requirements Sometimes standing and sometimes sitting down    Leisure None reported      Cognition   Overall Cognitive Status Within Functional Limits for tasks assessed      Observation/Other Assessments   Observations Patient appears in no apparent distress    Focus on Therapeutic Outcomes (FOTO)  NA - [facility]      Sensation   Light Touch Appears Intact      Coordination   Gross Motor Movements are Fluid and Coordinated Yes      Functional Tests   Functional tests Squat      Squat   Comments Decreased depth with weight shift toward left      ROM / Strength   AROM / PROM / Strength AROM;Strength      AROM   AROM Assessment Site Knee    Right/Left Knee Right;Left    Right Knee Extension -4    Right Knee Flexion 136    Left Knee Extension 0    Left Knee Flexion 139      Strength   Strength Assessment Site Hip;Knee    Right/Left Hip Right;Left    Right Hip Flexion 4/5    Right Hip Extension 4-/5    Right Hip ABduction 4-/5     Left Hip Flexion 4/5    Left Hip Extension 4/5    Left Hip ABduction 4/5    Right/Left Knee Right;Left    Right Knee Flexion 4/5    Right Knee Extension 4+/5    Left Knee Flexion 5/5    Left Knee Extension 5/5      Flexibility   Soft Tissue Assessment /Muscle Length yes    Hamstrings Limited bilat    Quadriceps WFL bilat      Palpation   Patella mobility Slightly limited on right    Palpation comment TTP medial and lateral joint line      Ambulation/Gait   Ambulation/Gait Yes    Ambulation/Gait Assistance 7: Independent    Gait Comments Decreased knee extension on right, antalgic on right                        Objective measurements completed on examination: See above findings.       Accident Adult PT Treatment/Exercise - 10/17/20 0001       Exercises   Exercises Knee/Hip      Knee/Hip Exercises: Stretches   Passive Hamstring Stretch 2 reps;30 seconds    Passive Hamstring Stretch Limitations seated    Gastroc Stretch 2 reps;30 seconds    Gastroc Stretch Limitations standing at counter      Knee/Hip Exercises: Supine   Quad Sets 10 reps   5 sec hold   Bridges 10 reps    Straight Leg Raises 10 reps      Knee/Hip Exercises: Sidelying   Hip ABduction 10 reps                     PT Education - 10/17/20 1610     Education Details Exam findings, POC, HEP    Person(s) Educated Patient    Methods Explanation;Demonstration;Tactile cues;Verbal cues;Handout    Comprehension Verbalized understanding;Returned demonstration;Verbal cues required;Tactile cues required;Need further instruction              PT Short Term Goals - 10/17/20 0903       PT SHORT TERM GOAL #1   Title Patient will be I with initial HEP to progress with  PT    Baseline provided at eval    Time 4    Period Weeks    Status New    Target Date 11/14/20      PT SHORT TERM GOAL #2   Title Patient will demo right knee extension to 0 deg in order to improve walking     Baseline lacking 4 deg    Time 4    Period Weeks    Status New    Target Date 11/14/20      PT SHORT TERM GOAL #3   Title Patient will report </= 7/10 pain with walking to reduce functional limitations    Baseline reports 10/10 pain with walking    Time 4    Period Weeks    Status New    Target Date 11/14/20               PT Long Term Goals - 10/17/20 0905       PT LONG TERM GOAL #1   Title Patient will be I with final HEP to maintain progress from PT    Baseline provided at eval    Time 8    Period Weeks    Status New    Target Date 12/12/20      PT LONG TERM GOAL #2   Title Patient will demonstrate 5/5 right knee strength to reduce pain with walking and stairs    Baseline knee extension 4+/5, knee flexion 4/5 MMT at eval    Time 8    Period Weeks    Status New    Target Date 12/12/20      PT LONG TERM GOAL #3   Title Patient will report </= 4/10 pain with walking to improve community access and shopping ability    Baseline 10/10 pain at eval    Time 8    Period Weeks    Status New    Target Date 12/12/20      PT LONG TERM GOAL #4   Title Patient will demonstrate >/= 4/5 MMT right hip strength to improve stair negotiation and squatting ability    Baseline hip strength grossly 4-/5 MMT on right    Time 8    Period Weeks    Status New    Target Date 12/12/20                    Plan - 10/17/20 0908     Clinical Impression Statement Patient presents to PT with report of chronic right knee pain that has been worsening since onset with no apparent mechanism of injury. Patient exhibits a limitation with right knee active motion with inability to achieve neutral knee extension, decreased quad and hip strength of the right side, hamstring/calf tightness on right compared to left, deviations with gait and difficulty walking and stair negotiation. Patient was provided exercises to initiate stretching and strengthening for the right knee and he would benefit  from continued skilled PT to progress his mobility and strength in order to reduce pain and maximize functiona ability with tasks such as walking, kneeling, stairs, and squatting.    Personal Factors and Comorbidities Fitness;Time since onset of injury/illness/exacerbation    Examination-Activity Limitations Locomotion Level;Sit;Squat;Stairs;Stand;Lift    Examination-Participation Restrictions Meal Prep;Cleaning;Occupation;Community Activity;Shop;Yard Work    Stability/Clinical Decision Making Stable/Uncomplicated    Clinical Decision Making Low    Rehab Potential Good    PT Frequency 1x / week    PT Duration 8 weeks  PT Treatment/Interventions ADLs/Self Care Home Management;Aquatic Therapy;Cryotherapy;Electrical Stimulation;Iontophoresis 4mg /ml Dexamethasone;Moist Heat;Traction;Ultrasound;Neuromuscular re-education;Balance training;Therapeutic exercise;Therapeutic activities;Functional mobility training;Stair training;Gait training;Patient/family education;Dry needling;Passive range of motion;Taping;Vasopneumatic Device;Spinal Manipulations;Joint Manipulations    PT Next Visit Plan Review HEP and progress PRN, patellar and knee mobs to improve mobility, stretching for hamstring, hip and knee strengthening progressing to closed chain    PT Home Exercise Plan [email or web address]    Consulted and Agree with Plan of Care Patient             Patient will benefit from skilled therapeutic intervention in order to improve the following deficits and impairments:  Abnormal gait, Decreased range of motion, Difficulty walking, Decreased activity tolerance, Pain, Decreased strength, Improper body mechanics, Impaired flexibility, Decreased balance  Visit Diagnosis: Chronic pain of right knee  Stiffness of right knee, not elsewhere classified  Other abnormalities of gait and mobility  Muscle weakness (generalized)     Problem List There are no problems to display for this patient.   Hilda Blades,  PT, DPT, LAT, ATC 10/17/20  12:18 PM Phone: [phone] Fax: [facility] [address] [place], [place], [address] Phone: [phone]   Fax:  [phone]  Name: Matthew Stark MRN: [id number] Date of Birth: 03/09/1960   Check all possible CPT codes: 22633- Therapeutic Exercise, [phone]- Neuro Re-education, [phone] - Gait Training, [phone] - Manual Therapy, [phone] - Therapeutic Activities, and [id number] - Welcome

## 2020-10-22 ENCOUNTER — Encounter: Payer: Self-pay | Admitting: Physical Therapy

## 2020-10-22 ENCOUNTER — Other Ambulatory Visit: Payer: Self-pay

## 2020-10-22 ENCOUNTER — Ambulatory Visit: Payer: Medicaid Other | Admitting: Physical Therapy

## 2020-10-22 DIAGNOSIS — M6281 Muscle weakness (generalized): Secondary | ICD-10-CM

## 2020-10-22 DIAGNOSIS — M25661 Stiffness of right knee, not elsewhere classified: Secondary | ICD-10-CM

## 2020-10-22 DIAGNOSIS — M25561 Pain in right knee: Secondary | ICD-10-CM | POA: Diagnosis not present

## 2020-10-22 DIAGNOSIS — R2689 Other abnormalities of gait and mobility: Secondary | ICD-10-CM

## 2020-10-22 DIAGNOSIS — G8929 Other chronic pain: Secondary | ICD-10-CM

## 2020-10-22 NOTE — Therapy (Signed)
[place], [place], [address] Phone: [phone]   Fax:  [phone]  Physical Therapy Treatment  Patient Details  Name: Matthew Stark MRN: [id number] Date of Birth: 1960/10/05 Referring Provider (PT): Willaim Sheng, MD   Encounter Date: 10/22/2020   PT End of Session - 10/22/20 1003     Visit Number 2    Number of Visits 8    Date for PT Re-Evaluation 12/12/20    Authorization Type [employer]    PT Start Time 1000    PT Stop Time 1043    PT Time Calculation (min) 43 min    Activity Tolerance Patient tolerated treatment well    Behavior During Therapy [facility] for tasks assessed/performed             History reviewed. No pertinent past medical history.  History reviewed. No pertinent surgical history.  There were no vitals filed for this visit.   Subjective Assessment - 10/22/20 1006     Subjective Pt reports that he is currently having not pain in his R knee.  He has some soreness in his low back he rates his low back pain as 8/10.  He has been completing his exercises at home. regularly.    Limitations Lifting;Standing;Walking;House hold activities;Sitting    How long can you sit comfortably? "only hurts when getting up after sitting long periods"    How long can you walk comfortably? "all walking is painful"    Diagnostic tests X-ray and MRI - not available    Patient Stated Goals Pain relief and improve walking    Pain Onset More than a month ago                [facility] PT Assessment - 10/22/20 0001       AROM   Right Knee Extension 4   lacking            [facility] Adult PT Treatment/Exercise:  Therapeutic Exercise: - nu-step L5 43m while taking subjective and planning session with patient - Quad set 20x with 5# weight on distal femur and heel propped - R HS stretch - contract - relax - contract - 3x - R hip flexor stretch - contract - relax - 3x - glute bridge -  2x10 - S/L hip abduction - 2x10 ea - slant board stretch - 45''x3 - step up - 6'' with concentration on knee ext - 10x - up with R, lower on L (unable to control eccentrically)  - knee ext machine - 5# - 2x10     PT Short Term Goals - 10/17/20 0903       PT SHORT TERM GOAL #1   Title Patient will be I with initial HEP to progress with PT    Baseline provided at eval    Time 4    Period Weeks    Status New    Target Date 11/14/20      PT SHORT TERM GOAL #2   Title Patient will demo right knee extension to 0 deg in order to improve walking    Baseline lacking 4 deg    Time 4    Period Weeks    Status New    Target Date 11/14/20      PT SHORT TERM GOAL #3   Title Patient will report </= 7/10 pain with walking to reduce functional limitations    Baseline reports 10/10 pain with walking    Time 4  Period Weeks    Status New    Target Date 11/14/20               PT Long Term Goals - 10/17/20 0905       PT LONG TERM GOAL #1   Title Patient will be I with final HEP to maintain progress from PT    Baseline provided at eval    Time 8    Period Weeks    Status New    Target Date 12/12/20      PT LONG TERM GOAL #2   Title Patient will demonstrate 5/5 right knee strength to reduce pain with walking and stairs    Baseline knee extension 4+/5, knee flexion 4/5 MMT at eval    Time 8    Period Weeks    Status New    Target Date 12/12/20      PT LONG TERM GOAL #3   Title Patient will report </= 4/10 pain with walking to improve community access and shopping ability    Baseline 10/10 pain at eval    Time 8    Period Weeks    Status New    Target Date 12/12/20      PT LONG TERM GOAL #4   Title Patient will demonstrate >/= 4/5 MMT right hip strength to improve stair negotiation and squatting ability    Baseline hip strength grossly 4-/5 MMT on right    Time 8    Period Weeks    Status New    Target Date 12/12/20                   Plan - 10/22/20  1033     Clinical Impression Statement Pt reports no increase in baseline pain following therapy  HEP was reviewed, but left unchanged    Overall, Matthew Stark is progressing well with therapy.  Today we concentrated on quad strengthening, hip strengthening, and knee range of motion.  Pt shows significant hip ext ROM deficit bil.  McMurray's (-) R knee.  Pt will continue to benefit from skilled physical therapy to address remaining deficits and achieve listed goals.  Continue per POC.    Personal Factors and Comorbidities Fitness;Time since onset of injury/illness/exacerbation    Examination-Activity Limitations Locomotion Level;Sit;Squat;Stairs;Stand;Lift    Examination-Participation Restrictions Meal Prep;Cleaning;Occupation;Community Activity;Shop;Yard Work    Stability/Clinical Decision Making Stable/Uncomplicated    Rehab Potential Good    PT Frequency 1x / week    PT Duration 8 weeks    PT Treatment/Interventions ADLs/Self Care Home Management;Aquatic Therapy;Cryotherapy;Electrical Stimulation;Iontophoresis 4mg /ml Dexamethasone;Moist Heat;Traction;Ultrasound;Neuromuscular re-education;Balance training;Therapeutic exercise;Therapeutic activities;Functional mobility training;Stair training;Gait training;Patient/family education;Dry needling;Passive range of motion;Taping;Vasopneumatic Device;Spinal Manipulations;Joint Manipulations    PT Next Visit Plan Review HEP and progress PRN, patellar and knee mobs to improve mobility, stretching for hamstring, hip and knee strengthening progressing to closed chain    PT Home Exercise Plan [id number]    Consulted and Agree with Plan of Care Patient             Patient will benefit from skilled therapeutic intervention in order to improve the following deficits and impairments:  Abnormal gait, Decreased range of motion, Difficulty walking, Decreased activity tolerance, Pain, Decreased strength, Improper body mechanics, Impaired flexibility,  Decreased balance  Visit Diagnosis: Chronic pain of right knee  Stiffness of right knee, not elsewhere classified  Other abnormalities of gait and mobility  Muscle weakness (generalized)     Problem List There are no problems to display  for this patient.   Mathis Dad, PT 10/22/2020, 10:39 AM  [facility] [address] [place], [place], [address] Phone: [phone]   Fax:  [phone]  Name: Matthew Stark MRN: [id number] Date of Birth: [birth year]

## 2020-11-05 ENCOUNTER — Encounter: Payer: Self-pay | Admitting: Physical Therapy

## 2020-11-05 ENCOUNTER — Ambulatory Visit: Payer: Medicaid Other | Attending: Orthopedic Surgery | Admitting: Physical Therapy

## 2020-11-05 ENCOUNTER — Other Ambulatory Visit: Payer: Self-pay

## 2020-11-05 DIAGNOSIS — G8929 Other chronic pain: Secondary | ICD-10-CM

## 2020-11-05 DIAGNOSIS — M25661 Stiffness of right knee, not elsewhere classified: Secondary | ICD-10-CM | POA: Diagnosis present

## 2020-11-05 DIAGNOSIS — R2689 Other abnormalities of gait and mobility: Secondary | ICD-10-CM | POA: Diagnosis present

## 2020-11-05 DIAGNOSIS — M25561 Pain in right knee: Secondary | ICD-10-CM | POA: Insufficient documentation

## 2020-11-05 DIAGNOSIS — M6281 Muscle weakness (generalized): Secondary | ICD-10-CM

## 2020-11-05 NOTE — Patient Instructions (Signed)
Access Code: [email or web address] URL: [email or web address] Date: 11/05/2020 Prepared by: Hilda Blades  Exercises Supine Quad Set - 1 x daily - 7 x weekly - 2 sets - 10 reps - 5 hold Active Straight Leg Raise with Quad Set - 1 x daily - 7 x weekly - 2 sets - 10 reps Supine Bridge - 1 x daily - 7 x weekly - 2 sets - 10 reps - 5 hold Sidelying Hip Abduction - 1 x daily - 7 x weekly - 2 sets - 10 reps Seated Hamstring Stretch - 2 x daily - 7 x weekly - 3 reps - 30 seconds hold Standing Gastroc Stretch at Counter - 2 x daily - 7 x weekly - 3 reps - 30 seconds hold Supine Quadriceps Stretch with Strap on Table - 2 x daily - 7 x weekly - 3 reps - 30 seconds hold Sit to Stand Without Arm Support - 1 x daily - 7 x weekly - 3 sets - 10 reps

## 2020-11-05 NOTE — Therapy (Signed)
[place], [place], [address] Phone: [phone]   Fax:  [phone]  Physical Therapy Treatment  Patient Details  Name: Matthew Stark MRN: [id number] Date of Birth: 12/13/1960 Referring Provider (PT): Willaim Sheng, MD   Encounter Date: 11/05/2020   PT End of Session - 11/05/20 0859     Visit Number 3    Number of Visits 8    Date for PT Re-Evaluation 12/12/20    Authorization Type [employer]    PT Start Time 0915    PT Stop Time 1000    PT Time Calculation (min) 45 min    Activity Tolerance Patient tolerated treatment well    Behavior During Therapy [facility] for tasks assessed/performed             History reviewed. No pertinent past medical history.  History reviewed. No pertinent surgical history.  There were no vitals filed for this visit.   Subjective Assessment - 11/05/20 0918     Subjective Patient reports he continues to feel better. States he continues to have pain in the right knee every day, especially when he is walking. He does note that bending the knee feels better.    Patient Stated Goals Pain relief and improve walking    Currently in Pain? Yes    Pain Score 9     Pain Location Knee    Pain Orientation Right    Pain Descriptors / Indicators Aching;Sore    Pain Type Chronic pain    Pain Onset More than a month ago    Pain Frequency Intermittent    Aggravating Factors  Walking                [facility] PT Assessment - 11/05/20 0001       AROM   Right Knee Extension 4   lacking     Ambulation/Gait   Gait Comments Antalgic on right                       [facility] Adult PT Treatment/Exercise:  Therapeutic Exercise: NuStep L6 x 5 min for knee motion and warm-up - while taking subjective Slant board calf stretch 3 x 30 sec Seated hamstring stretch 3 x 30 sec Modified thoma stretch PROM 3 x 30 sec Bridge 2 x 10 with 3 sec hold SLR 2 x 10  each Sidelying hip abduction 2 x 10 each Knee extension machine 10#  3 x 10 Leg press (omega): single leg 20# 2 x 10 each Sit<>stand x 10  Manual Therapy: N/A  Neuromuscular re-ed: N/A  Therapeutic Activity: N/A  Modalities: N/A  Self Care: N/A      Performed last visit 10/22/20: Therapeutic Exercise: - nu-step L5 73m while taking subjective and planning session with patient - Quad set 20x with 5# weight on distal femur and heel propped - R HS stretch - contract - relax - contract - 3x - R hip flexor stretch - contract - relax - 3x - glute bridge - 2x10 - S/L hip abduction - 2x10 ea - slant board stretch - 45''x3 - step up - 6'' with concentration on knee ext - 10x - up with R, lower on L (unable to control eccentrically)  - knee ext machine - 5# - 2x10        PT Education - 11/05/20 0857     Education Details HEP update    Person(s) Educated Patient  Methods Explanation;Demonstration;Verbal cues;Handout    Comprehension Verbalized understanding;Returned demonstration;Verbal cues required;Need further instruction              PT Short Term Goals - 11/05/20 0959       PT SHORT TERM GOAL #1   Title Patient will be I with initial HEP to progress with PT    Baseline progressing    Time 4    Period Weeks    Status On-going    Target Date 11/14/20      PT SHORT TERM GOAL #2   Title Patient will demo right knee extension to 0 deg in order to improve walking    Baseline lacking 4 deg    Time 4    Period Weeks    Status On-going    Target Date 11/14/20      PT SHORT TERM GOAL #3   Title Patient will report </= 7/10 pain with walking to reduce functional limitations    Baseline reports 9/10 pain with walking    Time 4    Period Weeks    Status On-going    Target Date 11/14/20               PT Long Term Goals - 10/17/20 0905       PT LONG TERM GOAL #1   Title Patient will be I with final HEP to maintain progress from PT    Baseline  provided at eval    Time 8    Period Weeks    Status New    Target Date 12/12/20      PT LONG TERM GOAL #2   Title Patient will demonstrate 5/5 right knee strength to reduce pain with walking and stairs    Baseline knee extension 4+/5, knee flexion 4/5 MMT at eval    Time 8    Period Weeks    Status New    Target Date 12/12/20      PT LONG TERM GOAL #3   Title Patient will report </= 4/10 pain with walking to improve community access and shopping ability    Baseline 10/10 pain at eval    Time 8    Period Weeks    Status New    Target Date 12/12/20      PT LONG TERM GOAL #4   Title Patient will demonstrate >/= 4/5 MMT right hip strength to improve stair negotiation and squatting ability    Baseline hip strength grossly 4-/5 MMT on right    Time 8    Period Weeks    Status New    Target Date 12/12/20                   Plan - 11/05/20 0920     Clinical Impression Statement Patient tolerated therapy well with no adverse effects. Continued therapy focus on improve knee motion and progressing his strength. He continues to demonstrate limitation in knee and hip extension on the right but is tolerating progressions in strengthening exercises without an increase in his knee pain. Updated HEP to progress hip flexibility and LE strength. Patient would benefit from continued skilled PT to progress his mobility and strength in order to reduce pain and maximize functiona ability with tasks such as walking, kneeling, stairs, and squatting.    PT Treatment/Interventions ADLs/Self Care Home Management;Aquatic Therapy;Cryotherapy;Electrical Stimulation;Iontophoresis 4mg /ml Dexamethasone;Moist Heat;Traction;Ultrasound;Neuromuscular re-education;Balance training;Therapeutic exercise;Therapeutic activities;Functional mobility training;Stair training;Gait training;Patient/family education;Dry needling;Passive range of motion;Taping;Vasopneumatic Device;Spinal Manipulations;Joint Manipulations     PT  Next Visit Plan Review HEP and progress PRN, patellar and knee mobs to improve mobility, stretching for hamstring, hip and knee strengthening progressing to closed chain    PT Home Exercise Plan [id number]    Consulted and Agree with Plan of Care Patient             Patient will benefit from skilled therapeutic intervention in order to improve the following deficits and impairments:  Abnormal gait, Decreased range of motion, Difficulty walking, Decreased activity tolerance, Pain, Decreased strength, Improper body mechanics, Impaired flexibility, Decreased balance  Visit Diagnosis: Chronic pain of right knee  Stiffness of right knee, not elsewhere classified  Other abnormalities of gait and mobility  Muscle weakness (generalized)     Problem List There are no problems to display for this patient.   Hilda Blades, PT, DPT, LAT, ATC 11/05/20  10:01 AM Phone: [phone] Fax: [facility] [address] [place], [place], [address] Phone: [phone]   Fax:  [phone]  Name: Matthew Stark MRN: [id number] Date of Birth: 03/22/1960

## 2020-11-12 ENCOUNTER — Ambulatory Visit: Payer: Medicaid Other | Admitting: Physical Therapy

## 2020-11-12 ENCOUNTER — Telehealth: Payer: Self-pay | Admitting: Physical Therapy

## 2020-11-12 NOTE — Telephone Encounter (Signed)
Called and informed patient of missed visit and provided reminder of next appt and attendance policy.  

## 2020-11-19 ENCOUNTER — Other Ambulatory Visit: Payer: Self-pay

## 2020-11-19 ENCOUNTER — Encounter: Payer: Self-pay | Admitting: Physical Therapy

## 2020-11-19 ENCOUNTER — Ambulatory Visit: Payer: Medicaid Other | Admitting: Physical Therapy

## 2020-11-19 DIAGNOSIS — M25561 Pain in right knee: Secondary | ICD-10-CM

## 2020-11-19 DIAGNOSIS — M25661 Stiffness of right knee, not elsewhere classified: Secondary | ICD-10-CM

## 2020-11-19 DIAGNOSIS — R2689 Other abnormalities of gait and mobility: Secondary | ICD-10-CM

## 2020-11-19 DIAGNOSIS — M6281 Muscle weakness (generalized): Secondary | ICD-10-CM

## 2020-11-19 DIAGNOSIS — G8929 Other chronic pain: Secondary | ICD-10-CM

## 2020-11-19 NOTE — Therapy (Signed)
[place], [place], [address] Phone: [phone]   Fax:  [phone]  Physical Therapy Treatment / Discharge  Patient Details  Name: Matthew Stark MRN: [id number] Date of Birth: 12-13-1960 Referring Provider (PT): Willaim Sheng, MD   Encounter Date: 11/19/2020   PT End of Session - 11/19/20 0928     Visit Number 4    Number of Visits 8    Date for PT Re-Evaluation 12/12/20    Authorization Type [employer]    PT Start Time 0914    PT Stop Time 1000    PT Time Calculation (min) 46 min    Activity Tolerance Patient tolerated treatment well    Behavior During Therapy [facility] for tasks assessed/performed             History reviewed. No pertinent past medical history.  History reviewed. No pertinent surgical history.  There were no vitals filed for this visit.   Subjective Assessment - 11/19/20 0916     Subjective Patient reports he is having a lot of pain in the right knee. States it is bad. He is unable to walk long periods and putting any weight on the knee causes severe pain.    Limitations Lifting;Standing;Walking;House hold activities;Sitting    How long can you walk comfortably? "all walking is painful"    Patient Stated Goals Pain relief and improve walking    Currently in Pain? Yes    Pain Score 9     Pain Location Knee    Pain Orientation Right    Pain Descriptors / Indicators Aching;Sore    Pain Type Chronic pain    Pain Onset More than a month ago    Pain Frequency Constant    Aggravating Factors  Walking                [facility] PT Assessment - 11/19/20 0001       Assessment   Medical Diagnosis Right Knee OA    Referring Provider (PT) Willaim Sheng, MD      Precautions   Precautions None      Restrictions   Weight Bearing Restrictions No      Balance Screen   Has the patient fallen in the past 6 months No      Prior Function   Level of  Independence Independent      Observation/Other Assessments   Observations Patient appears in no apparent distress    Focus on Therapeutic Outcomes (FOTO)  NA - MCD      AROM   Right Knee Extension 5   lacking   Right Knee Flexion 135      Strength   Right Hip Extension 4-/5    Right Hip ABduction 4-/5    Left Hip Extension 4/5    Left Hip ABduction 4/5    Right Knee Flexion 4/5    Right Knee Extension 4/5   limited secondary to pain   Left Knee Flexion 5/5    Left Knee Extension 5/5      Flexibility   Hamstrings Limited bilat    Quadriceps Hip flexor limited bilat      Ambulation/Gait   Ambulation/Gait Yes    Ambulation/Gait Assistance 7: Independent    Gait Comments Varus alignment, significant antalgia on right                  [facility] Adult PT Treatment/Exercise:  Therapeutic Exercise: NuStep L5 x 5 min  with LE while taking subjective Quad set with towel roll under knee 2 x 10 with 5 sec hold SLR 2 x 10 Bridge 2 x 10 Sidelying hip abduction 2 x 10 each LAQ with red 2 x 10 Seated hamstring curl with red 2 x 10 Seated hamstring stretch 2 x 30 sec each Sit to stand 2 x 10 Standing TKE with green 2 x 10 Standing calf stretch at counter 2 x 30 sec Modified thomas stretch x 30 sec  Manual Therapy: N/A  Neuromuscular re-ed: N/A  Therapeutic Activity: N/A  Modalities: N/A  Self Care: N/A                   PT Education - 11/19/20 0927     Education Details Discharge from PT due to lack of progress, follow-up with referring provider, HEP    Person(s) Educated Patient    Methods Explanation;Demonstration;Handout    Comprehension Verbalized understanding;Returned demonstration              PT Short Term Goals - 11/19/20 0931       PT SHORT TERM GOAL #1   Title Patient will be I with initial HEP to progress with PT    Baseline patient I with HEP    Time 4    Period Weeks    Status Achieved    Target Date 11/14/20       PT SHORT TERM GOAL #2   Title Patient will demo right knee extension to 0 deg in order to improve walking    Baseline lacking 5 deg    Time 4    Period Weeks    Status Not Met    Target Date 11/14/20      PT SHORT TERM GOAL #3   Title Patient will report </= 7/10 pain with walking to reduce functional limitations    Baseline reports 9/10 pain with walking    Time 4    Period Weeks    Status Not Met    Target Date 11/14/20               PT Long Term Goals - 11/19/20 0931       PT LONG TERM GOAL #1   Title Patient will be I with final HEP to maintain progress from PT    Baseline patient I with the HEP    Time 8    Period Weeks    Status Achieved      PT LONG TERM GOAL #2   Title Patient will demonstrate 5/5 right knee strength to reduce pain with walking and stairs    Baseline knee strength grossly 4/5 MMT, limited secondary to pain    Time 8    Period Weeks    Status Not Met      PT LONG TERM GOAL #3   Title Patient will report </= 4/10 pain with walking to improve community access and shopping ability    Baseline 9/10 pain at eval    Time 8    Period Weeks    Status Not Met      PT LONG TERM GOAL #4   Title Patient will demonstrate >/= 4/5 MMT right hip strength to improve stair negotiation and squatting ability    Baseline hip strength grossly 4-/5 MMT on right    Time 8    Period Weeks    Status Not Met  Plan - 11/19/20 0929     Clinical Impression Statement Patient tolerated therapy well with no adverse effects. Patient has demonstrated no improvement in therapy and actually demonstrates slight regression in knee strength secondary to pain level. Patient provided finalized HEP and he demonstrates independence with those exercises. He was advised to follow-up with his referring provider and that he would neeed a new referral to PT if he would like to return for therapy. Patient will be discharged from PT due to lack of progress and  persistent high levels of pain.    Personal Factors and Comorbidities Fitness;Time since onset of injury/illness/exacerbation    Examination-Activity Limitations Locomotion Level;Sit;Squat;Stairs;Stand;Lift    Examination-Participation Restrictions Meal Prep;Cleaning;Occupation;Community Activity;Shop;Yard Work    PT Treatment/Interventions ADLs/Self Care Home Management;Aquatic Therapy;Cryotherapy;Electrical Stimulation;Iontophoresis 65m/ml Dexamethasone;Moist Heat;Traction;Ultrasound;Neuromuscular re-education;Balance training;Therapeutic exercise;Therapeutic activities;Functional mobility training;Stair training;Gait training;Patient/family education;Dry needling;Passive range of motion;Taping;Vasopneumatic Device;Spinal Manipulations;Joint Manipulations    PT Next Visit Plan NA - discharge    PT Home Exercise Plan [id number]   Consulted and Agree with Plan of Care Patient             Patient will benefit from skilled therapeutic intervention in order to improve the following deficits and impairments:  Abnormal gait, Decreased range of motion, Difficulty walking, Decreased activity tolerance, Pain, Decreased strength, Improper body mechanics, Impaired flexibility, Decreased balance  Visit Diagnosis: Chronic pain of right knee  Stiffness of right knee, not elsewhere classified  Other abnormalities of gait and mobility  Muscle weakness (generalized)     Problem List There are no problems to display for this patient.   CHilda Blades PT, DPT, LAT, ATC 11/19/20  10:03 AM Phone: [phone]: [facility] [address]Phone: [phone]  Fax:  [phone] Name: Matthew HYAMSMRN: [id number] of Birth: [birth year]  PHYSICAL THERAPY DISCHARGE SUMMARY  Visits from Start of Care: 4  Current functional level related to goals / functional outcomes: See above    Remaining deficits: See above   Education / Equipment: HEP   Patient agrees to discharge. Patient goals were not met. Patient is being discharged due to lack of progress.

## 2020-11-19 NOTE — Patient Instructions (Signed)
Access Code: [email or web address] URL: [email or web address] Date: 11/19/2020 Prepared by: Hilda Blades  Exercises Supine Quad Set - 1 x daily - 7 x weekly - 3 sets - 10 reps - 5 hold Active Straight Leg Raise with Quad Set - 1 x daily - 7 x weekly - 3 sets - 10 reps Supine Bridge - 1 x daily - 7 x weekly - 3 sets - 10 reps - 5 hold Sidelying Hip Abduction - 1 x daily - 7 x weekly - 3 sets - 10 reps Seated Knee Extension with Anchored Resistance - 1 x daily - 7 x weekly - 3 sets - 10 reps Seated Knee Flexion with Anchored Resistance - 1 x daily - 7 x weekly - 3 sets - 10 reps Sit to Stand Without Arm Support - 1 x daily - 7 x weekly - 3 sets - 10 reps Standing Terminal Knee Extension with Resistance - 1 x daily - 7 x weekly - 3 sets - 10 reps Seated Hamstring Stretch - 2 x daily - 7 x weekly - 3 reps - 30 seconds hold Standing Gastroc Stretch at Counter - 2 x daily - 7 x weekly - 3 reps - 30 seconds hold Modified Thomas Stretch - 2 x daily - 7 x weekly - 3 reps - 30 seconds hold

## 2021-01-24 ENCOUNTER — Other Ambulatory Visit: Payer: Self-pay

## 2021-01-24 ENCOUNTER — Ambulatory Visit: Payer: Medicaid Other | Attending: Orthopedic Surgery

## 2021-01-24 DIAGNOSIS — M6281 Muscle weakness (generalized): Secondary | ICD-10-CM | POA: Insufficient documentation

## 2021-01-24 DIAGNOSIS — M25561 Pain in right knee: Secondary | ICD-10-CM | POA: Insufficient documentation

## 2021-01-24 DIAGNOSIS — G8929 Other chronic pain: Secondary | ICD-10-CM | POA: Diagnosis present

## 2021-01-24 DIAGNOSIS — R2689 Other abnormalities of gait and mobility: Secondary | ICD-10-CM | POA: Insufficient documentation

## 2021-01-24 NOTE — Therapy (Signed)
OUTPATIENT PHYSICAL THERAPY LOWER EXTREMITY EVALUATION   Patient Name: Matthew Stark MRN: [id number] DOB:1960-09-24, 61 y.o., male Today's Date: 01/24/2021   PT End of Session - 01/24/21 1117     Visit Number 1    Number of Visits 17    Date for PT Re-Evaluation 03/21/21    Authorization Type [employer]    Authorization Time Period Pending Auth    PT Start Time 1115    PT Stop Time 1200    PT Time Calculation (min) 45 min    Activity Tolerance Patient tolerated treatment well    Behavior During Therapy WFL for tasks assessed/performed             Past Medical History:  Diagnosis Date   Hypertension    History reviewed. No pertinent surgical history. There are no problems to display for this patient.   PCP: No primary care provider on file.  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 He states he has had a lot of swelling and pain during this time. He also reports daily severe morning stiffness. He adds that he can also having popping/ crackling in his knee when bending it. He adds that he can have Rt LE numbness in his entire leg after prolonged walking. He reports receiving two steroid injections, which have helped for about 5 months each. He reports receiving recent X-rays of his Rt knee, which are not in the patients chart. He states that the X-rays showed significant arthritis. He is currently scheduled for a Rt TKA on 03/02/2021 and is partaking in PT for pre-hab purposes. Red flag screening negative. He is also scheduled for colon surgery to remove pre-cancerous polyps 6 months following his knee  replacement.  PERTINENT HISTORY: Scheduled Rt knee TKA 03/02/2021  PAIN:  Are you having pain? Yes NPRS scale: 10/10 Pain location: Rt knee PAIN TYPE: sharp Aggravating factors: Walking > 100 meters Relieving factors: Cold weather  PRECAUTIONS: None  WEIGHT BEARING RESTRICTIONS No  FALLS:  Has patient fallen in last 6 months? No, Number of falls: 0  LIVING ENVIRONMENT: Lives with: lives with their family Lives in: House/apartment Stairs: Yes; Internal: 15 steps; on left going up Has following equipment at home: None  OCCUPATION: Applied for disability, unemployed  PLOF: [place] Return to walking, exercise, perform yard work   OBJECTIVE:   DIAGNOSTIC FINDINGS: None related to current problem   COGNITION:  Overall cognitive status: Within functional limits for tasks assessed     SENSATION:  Light touch: Appears intact  MUSCLE LENGTH: Hamstring 90/90 test: Right 55 deg; Left 40 deg  POSTURE:  WNL  PALPATION: TTP to medial and lateral border of Rt patella, no joint line tenderness  PASSIVE ACCESSORIES: Painful and hypomobile Rt patellar mobilizations in all planes on Rt  LE AROM/PROM:  A/PROM Right 01/24/2021 Left 01/24/2021  Knee flexion 125/127p! 140/145  Knee extension -12/ -8p! 0/ 3   (Blank rows = not tested)  LE MMT:  MMT Right 01/24/2021 Left 01/24/2021  Hip flexion 4+/5 5/5  Hip extension 3+/5 4/5  Hip abduction 4/5 4/5  Knee flexion 4+/5 5/5  Knee extension 3+/5 with pain 5/5   (Blank rows = not tested)   FUNCTIONAL TESTS:  Squat: 75% limited with pain Lunge: 75% limited with pain on Rt, 50% limited on Lt SLS: WNL on Lt, step error after 5 seconds on Rt  GAIT: Distance walked: 30 ft Assistive device utilized: None Level of assistance: Complete Independence Comments: Antalgic gait pattern with decreased Rt stance time, decreased terminal knee extension, decreased pelvic rotation    TODAY'S TREATMENT: [facility]  Adult PT Treatment:                                                DATE: 01/27/2021 Therapeutic Exercise: Seated active hamstring stretch on Rt x1 minute Supine bridge x10 with 3-sec hold Sidelying hip abduction x10 with 3-sec hold BIL Mini squat x10 Seated LAQ x10 with 3-sec hold on Rt Manual Therapy: N/A Neuromuscular re-ed: N/A Therapeutic Activity: N/A Modalities: N/A Self Care: N/A    PATIENT EDUCATION:  Education details: Educated on POC, prognosis, and HEP Person educated: Patient Education method: Consulting civil engineer, Media planner, and Handouts Education comprehension: verbalized understanding and returned demonstration   HOME EXERCISE PROGRAM: Access Code: [email or web address] URL: [email or web address] Date: 01/24/2021 Prepared by: Vanessa [place]  Exercises Seated Long Arc Quad - 1 x daily - 7 x weekly - 3 sets - 10 reps - 3-sec hold Mini Squat with Counter Support - 1 x daily - 7 x weekly - 3 sets - 10 reps Seated Hamstring Stretch - 1 x daily - 7 x weekly - 2 sets - 1 min hold Sidelying Hip Abduction - 1 x daily - 7 x weekly - 2 sets - 10 reps - 3-sec hold Supine Bridge - 1 x daily - 7 x weekly - 3 sets - 10 reps - 3-sec hold   ASSESSMENT:  CLINICAL IMPRESSION: Patient is a 61 y.o. M who was seen today for physical therapy evaluation and treatment for pre-op physical therapy for his scheduled Rt TKA on 03/02/2021. Objective impairments include decreased Rt knee flexion and extension AROM, limited and painful Rt knee flexion and abduction PROM, painful and limited squatting and lunging, weak and painful Rt knee extenion MMT, Severely limited Rt>Lt hamstring extensibility, TTP to medial and lateral aspects of Rt patella, painful and limited Rt patellar passive accesories, and weak BIL hip abduction and extension. These impairments are limiting patient from community activity, yard work, and shopping. Patient will benefit from skilled PT to address above impairments  and improve overall function.  REHAB POTENTIAL: Good  CLINICAL DECISION MAKING: Stable/uncomplicated  EVALUATION COMPLEXITY: Low   GOALS: Goals reviewed with patient? Yes  SHORT TERM GOALS:  STG Name Target Date Goal status  1 Pt will report understanding and adherence to his HEP in order to promote independence in the management of his primary impairments. Baseline: HEP provided at eval 02/21/2021 INITIAL   LONG TERM GOALS:   LTG Name Target Date Goal status  1 Pt will report ability to walk 10 minutes with 0-5/10 pain in order to grocery shop with less limitation. Baseline: Pt only able to walk about 100 meters before needing a rest due to pain. 03/21/2021 INITIAL  2 Pt will achieve Rt knee extension strength of 4+/5 or greater in order to promote increased post-op strength for rehabilitative purposes. Baseline: 3+/5 03/21/2021 INITIAL  3 Pt will demonstrate 5 weight-bearing squat limited by  25% or less with 0-5/10 pain in order to return to doing yard work with less limitation. Baseline: 50% limited squat with 8/10 pain 03/21/2021 INITIAL  4 Pt will achieve Rt knee extension AROM of -5 degrees or better in order to promote WNL gait for community ambulation Baseline: -12 degrees 03/21/2021 INITIAL   PLAN: PT FREQUENCY: 2x/week  PT DURATION: 8 weeks  PLANNED INTERVENTIONS: Therapeutic exercises, Therapeutic activity, Neuro Muscular re-education, Balance training, Gait training, Patient/Family education, Joint mobilization, Stair training, Aquatic Therapy, Dry Needling, Cryotherapy, Moist heat, Taping, and Manual therapy  PLAN FOR NEXT SESSION: Progress early quad/ hip strengthening, knee mobility   Vanessa [place], PT, DPT 01/24/21 11:58 AM  Check all possible CPT codes: 64353- Therapeutic Exercise, [phone]- Neuro Re-education, [phone] - Gait Training, [phone] - Manual Therapy, [place] - Therapeutic Activities, [phone] - Self Care, and [phone] - Aquatic therapy

## 2021-01-26 NOTE — Therapy (Incomplete)
OUTPATIENT PHYSICAL THERAPY TREATMENT NOTE   Patient Name: Matthew Stark MRN: [id number] DOB:[birth year], 61 y.o., male Today's Date: 01/26/2021  PCP: No primary care provider on file. REFERRING PROVIDER: No ref. provider found    Past Medical History:  Diagnosis Date   Hypertension    No past surgical history on file. There are no problems to display for this patient.   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  PERTINENT HISTORY: Scheduled Rt knee TKA 03/02/2021  PRECAUTIONS: None  SUBJECTIVE: ***  PAIN:  Are you having pain? {yes/no:20286} NPRS scale: ***/10 Pain location: *** Pain orientation: {Pain Orientation:25161}  PAIN TYPE: {type:313116} Pain description: {PAIN DESCRIPTION:21022940}  Aggravating factors: *** Relieving factors: ***   OBJECTIVE:    MUSCLE LENGTH: Hamstring 90/90 test: Right 55 deg; Left 40 deg   POSTURE:  WNL   PALPATION: TTP to medial and lateral border of Rt patella, no joint line tenderness   PASSIVE ACCESSORIES: Painful and hypomobile Rt patellar mobilizations in all planes on Rt   LE AROM/PROM:   A/PROM Right 01/24/2021 Left 01/24/2021  Knee flexion 125/127p! 140/145  Knee extension -12/ -8p! 0/ 3   (Blank rows = not tested)   LE MMT:   MMT Right 01/24/2021 Left 01/24/2021  Hip flexion 4+/5 5/5  Hip extension 3+/5 4/5  Hip abduction 4/5 4/5  Knee flexion 4+/5 5/5  Knee extension 3+/5 with pain 5/5   (Blank rows = not tested)     FUNCTIONAL TESTS:  Squat: 75% limited with pain Lunge: 75% limited with pain on Rt, 50% limited on Lt SLS: WNL on Lt, step error after 5 seconds on Rt      TODAY'S TREATMENT: [facility] Adult PT Treatment:                                                DATE: 01/27/2021 Therapeutic Exercise: Seated active hamstring stretch on Rt x1 minute Supine bridge x10 with 3-sec hold Sidelying hip abduction x10 with 3-sec hold BIL Mini squat  x10 Seated LAQ x10 with 3-sec hold on Rt Manual Therapy: N/A Neuromuscular re-ed: N/A Therapeutic Activity: N/A Modalities: N/A Self Care: N/A       PATIENT EDUCATION:  Education details: Educated on POC, prognosis, and HEP Person educated: Patient Education method: Consulting civil engineer, Media planner, and Handouts Education comprehension: verbalized understanding and returned demonstration     HOME EXERCISE PROGRAM: Access Code: [email or web address] URL: [email or web address] Date: 01/24/2021 Prepared by: Vanessa [place]   Exercises Seated Long Arc Quad - 1 x daily - 7 x weekly - 3 sets - 10 reps - 3-sec hold Mini Squat with Counter Support - 1 x daily - 7 x weekly - 3 sets - 10 reps Seated Hamstring Stretch - 1 x daily - 7 x weekly - 2 sets - 1 min hold Sidelying Hip Abduction - 1 x daily - 7 x weekly - 2 sets - 10 reps - 3-sec hold Supine Bridge - 1 x daily - 7 x weekly - 3 sets - 10 reps - 3-sec hold     ASSESSMENT:   CLINICAL IMPRESSION: ***   REHAB POTENTIAL: Good   CLINICAL DECISION MAKING: Stable/uncomplicated   EVALUATION COMPLEXITY: Low     GOALS: Goals reviewed with patient? Yes   SHORT TERM GOALS:   STG Name Target Date  Goal status  1 Pt will report understanding and adherence to his HEP in order to promote independence in the management of his primary impairments. Baseline: HEP provided at eval 02/21/2021 INITIAL    LONG TERM GOALS:    LTG Name Target Date Goal status  1 Pt will report ability to walk 10 minutes with 0-5/10 pain in order to grocery shop with less limitation. Baseline: Pt only able to walk about 100 meters before needing a rest due to pain. 03/21/2021 INITIAL  2 Pt will achieve Rt knee extension strength of 4+/5 or greater in order to promote increased post-op strength for rehabilitative purposes. Baseline: 3+/5 03/21/2021 INITIAL  3 Pt will demonstrate 5 weight-bearing squat limited by 25% or less with 0-5/10 pain in order to  return to doing yard work with less limitation. Baseline: 50% limited squat with 8/10 pain 03/21/2021 INITIAL  4 Pt will achieve Rt knee extension AROM of -5 degrees or better in order to promote WNL gait for community ambulation Baseline: -12 degrees 03/21/2021 INITIAL    PLAN: PT FREQUENCY: 2x/week   PT DURATION: 8 weeks   PLANNED INTERVENTIONS: Therapeutic exercises, Therapeutic activity, Neuro Muscular re-education, Balance training, Gait training, Patient/Family education, Joint mobilization, Stair training, Aquatic Therapy, Dry Needling, Cryotherapy, Moist heat, Taping, and Manual therapy   PLAN FOR NEXT SESSION: Progress early quad/ hip strengthening, knee mobility    Ward Chatters 01/26/2021, 8:01 AM

## 2021-01-28 ENCOUNTER — Ambulatory Visit: Payer: Medicaid Other

## 2021-01-28 ENCOUNTER — Other Ambulatory Visit: Payer: Self-pay

## 2021-01-28 DIAGNOSIS — R2689 Other abnormalities of gait and mobility: Secondary | ICD-10-CM

## 2021-01-28 DIAGNOSIS — M25561 Pain in right knee: Secondary | ICD-10-CM | POA: Diagnosis not present

## 2021-01-28 DIAGNOSIS — M6281 Muscle weakness (generalized): Secondary | ICD-10-CM

## 2021-01-28 NOTE — Therapy (Signed)
OUTPATIENT PHYSICAL THERAPY TREATMENT NOTE   Patient Name: Matthew Stark MRN: [id number] DOB:1960-12-05, 61 y.o., male Today's Date: 01/28/2021  PCP: No primary care provider on file. REFERRING PROVIDER: Willaim Sheng, MD   PT End of Session - 01/28/21 1559     Visit Number 2    Number of Visits 17    Date for PT Re-Evaluation 03/21/21    Authorization Type [employer]    Authorization Time Period 01/28/2021- 03/27/2021    Authorization - Visit Number 1    Authorization - Number of Visits 16    PT Start Time 1600    PT Stop Time 1645    PT Time Calculation (min) 45 min    Activity Tolerance Patient tolerated treatment well    Behavior During Therapy WFL for tasks assessed/performed             Past Medical History:  Diagnosis Date   Hypertension    History reviewed. No pertinent surgical history. There are no problems to display for this patient.   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 He reports that his initial HEP has been going well.  PAIN:  Are you having pain? Yes NPRS scale: 4/10 Pain location: Knee Pain orientation: Right  PAIN TYPE: aching Pain description: intermittent    OBJECTIVE:   *Unless otherwise noted, objective information collected previously* DIAGNOSTIC FINDINGS: None related to current problem     COGNITION:          Overall cognitive status: Within functional limits for tasks assessed                        SENSATION:          Light touch: Appears intact   MUSCLE LENGTH: Hamstring 90/90 test: Right 55 deg; Left 40 deg   POSTURE:  WNL   PALPATION: TTP to medial and lateral border of Rt patella, no joint  line tenderness   PASSIVE ACCESSORIES: Painful and hypomobile Rt patellar mobilizations in all planes on Rt   LE AROM/PROM:   A/PROM Right 01/24/2021 Left 01/24/2021  Knee flexion 125/127p! 140/145  Knee extension -12/ -8p! 0/ 3   (Blank rows = not tested)   LE MMT:   MMT Right 01/24/2021 Left 01/24/2021  Hip flexion 4+/5 5/5  Hip extension 3+/5 4/5  Hip abduction 4/5 4/5  Knee flexion 4+/5 5/5  Knee extension 3+/5 with pain 5/5   (Blank rows = not tested)     FUNCTIONAL TESTS:  Squat: 75% limited with pain Lunge: 75% limited with pain on Rt, 50% limited on Lt SLS: WNL on Lt, step error after 5 seconds on Rt   GAIT: Distance walked: 30 ft Assistive device utilized: None Level of assistance: Complete Independence Comments: Antalgic gait pattern with decreased Rt stance time, decreased terminal knee extension, decreased pelvic rotation       TODAY'S TREATMENT:  [facility] Adult PT Treatment:  DATE: 01/28/2021 Therapeutic Exercise: Bridge with pillow adduction squeeze 3x10 with 3-sec hold Standing Cybex hip abduction 2x10 with 3-sec hold BIL with 25# cable Standing Cybex hip extension 2x10 with 3-sec hold BIL with 25# cable Seated double knee extension machine 2x10 with 3-sec lowering, 5# Prone hamstring curls with 2# ankle weights 3x8 with 3-sec lowering Supine SLR with hip abduction 2x10 BIL Long-sitting calf stretch with sheet x1 min BIL Manual Therapy: N/A Neuromuscular re-ed: N/A Therapeutic Activity: N/A Modalities: N/A Self Care: N/A   [facility] Adult PT Treatment:                                                DATE: 01/27/2021 Therapeutic Exercise: Seated active hamstring stretch on Rt x1 minute Supine bridge x10 with 3-sec hold Sidelying hip abduction x10 with 3-sec hold BIL Mini squat x10 Seated LAQ x10 with 3-sec hold on Rt Manual Therapy: N/A Neuromuscular re-ed: N/A Therapeutic  Activity: N/A Modalities: N/A Self Care: N/A       PATIENT EDUCATION:  Education details: Educated on POC, prognosis, and HEP Person educated: Patient Education method: Consulting civil engineer, Media planner, and Handouts Education comprehension: verbalized understanding and returned demonstration     HOME EXERCISE PROGRAM: Access Code: [email or web address] URL: [email or web address] Date: 01/24/2021 Prepared by: Vanessa [place]   Exercises Seated Long Arc Quad - 1 x daily - 7 x weekly - 3 sets - 10 reps - 3-sec hold Mini Squat with Counter Support - 1 x daily - 7 x weekly - 3 sets - 10 reps Seated Hamstring Stretch - 1 x daily - 7 x weekly - 2 sets - 1 min hold Sidelying Hip Abduction - 1 x daily - 7 x weekly - 2 sets - 10 reps - 3-sec hold Supine Bridge - 1 x daily - 7 x weekly - 3 sets - 10 reps - 3-sec hold     ASSESSMENT:   CLINICAL IMPRESSION: Pt responded well to all interventions today, demonstrating good form and no increase in pain with selected exercises. He demonstrates moderate impairments in functional LE strength as indicated with need for low loading with open chain knee and hip strengthening. He will continue to benefit from skilled PT to address his primary impairments and help him establish an improved functional baseline prior to knee surgery next month.   REHAB POTENTIAL: Good   CLINICAL DECISION MAKING: Stable/uncomplicated   EVALUATION COMPLEXITY: Low     GOALS: Goals reviewed with patient? Yes   SHORT TERM GOALS:   STG Name Target Date Goal status  1 Pt will report understanding and adherence to his HEP in order to promote independence in the management of his primary impairments. Baseline: HEP provided at eval 02/21/2021 INITIAL    LONG TERM GOALS:    LTG Name Target Date Goal status  1 Pt will report ability to walk 10 minutes with 0-5/10 pain in order to grocery shop with less limitation. Baseline: Pt only able to walk about 100 meters before  needing a rest due to pain. 03/21/2021 INITIAL  2 Pt will achieve Rt knee extension strength of 4+/5 or greater in order to promote increased post-op strength for rehabilitative purposes. Baseline: 3+/5 03/21/2021 INITIAL  3 Pt will demonstrate 5 weight-bearing squat limited by 25% or less with 0-5/10 pain in order to return to doing yard work with less limitation. Baseline: 50% limited  squat with 8/10 pain 03/21/2021 INITIAL  4 Pt will achieve Rt knee extension AROM of -5 degrees or better in order to promote WNL gait for community ambulation Baseline: -12 degrees 03/21/2021 INITIAL    PLAN: PT FREQUENCY: 2x/week   PT DURATION: 8 weeks   PLANNED INTERVENTIONS: Therapeutic exercises, Therapeutic activity, Neuro Muscular re-education, Balance training, Gait training, Patient/Family education, Joint mobilization, Stair training, Aquatic Therapy, Dry Needling, Cryotherapy, Moist heat, Taping, and Manual therapy    Vanessa [place], PT, DPT 01/28/21 4:42 PM

## 2021-01-28 NOTE — Therapy (Unsigned)
OUTPATIENT PHYSICAL THERAPY TREATMENT NOTE   Patient Name: Matthew Stark MRN: [id number] DOB:[birth year], 61 y.o., male Today's Date: 01/28/2021  PCP: No primary care provider on file. REFERRING PROVIDER: Willaim Sheng, MD    Past Medical History:  Diagnosis Date   Hypertension    No past surgical history on file. There are no problems to display for this patient.   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 Yes NPRS scale: 10/10 Pain location: Rt knee PAIN TYPE: sharp Aggravating factors: Walking > 100 meters Relieving factors: Cold weather   OBJECTIVE:    DIAGNOSTIC FINDINGS: None related to current problem     COGNITION:          Overall cognitive status: Within functional limits for tasks assessed                        SENSATION:          Light touch: Appears intact   MUSCLE LENGTH: Hamstring 90/90 test: Right 55 deg; Left 40 deg   POSTURE:  WNL   PALPATION: TTP to medial and lateral border of Rt patella, no joint line tenderness   PASSIVE ACCESSORIES: Painful and hypomobile Rt patellar mobilizations in all planes on Rt   LE AROM/PROM:   A/PROM Right 01/24/2021 Left 01/24/2021  Knee flexion 125/127p! 140/145  Knee extension -12/ -8p! 0/ 3   (Blank rows = not tested)   LE MMT:   MMT Right 01/24/2021 Left 01/24/2021  Hip flexion 4+/5 5/5  Hip extension 3+/5 4/5  Hip abduction 4/5 4/5  Knee flexion 4+/5 5/5  Knee extension 3+/5 with pain 5/5   (Blank rows = not tested)     FUNCTIONAL TESTS:  Squat: 75% limited with pain Lunge: 75% limited with pain on Rt, 50% limited on Lt SLS: WNL on Lt, step error after 5 seconds on  Rt   GAIT: Distance walked: 30 ft Assistive device utilized: None Level of assistance: Complete Independence Comments: Antalgic gait pattern with decreased Rt stance time, decreased terminal knee extension, decreased pelvic rotation       TODAY'S TREATMENT: [facility] Adult PT Treatment:                                                DATE: 01/27/2021 Therapeutic Exercise: Seated active hamstring stretch on Rt x1 minute Supine bridge x10 with 3-sec hold Sidelying hip abduction x10 with 3-sec hold BIL Mini squat x10 Seated LAQ x10 with 3-sec hold on Rt Manual Therapy: N/A Neuromuscular re-ed: N/A Therapeutic Activity: N/A Modalities: N/A Self Care: N/A       PATIENT EDUCATION:  Education details: Educated on POC, prognosis, and HEP Person educated: Patient Education method: Consulting civil engineer, Media planner, and Handouts Education comprehension: verbalized understanding and returned demonstration     HOME EXERCISE PROGRAM: Access Code: [email or web address] URL: [email or web address] Date: 01/24/2021 Prepared by: Vanessa [place]   Exercises Seated Long Arc Quad - 1 x daily - 7 x weekly - 3 sets - 10 reps - 3-sec hold Mini Squat with Counter  Support - 1 x daily - 7 x weekly - 3 sets - 10 reps Seated Hamstring Stretch - 1 x daily - 7 x weekly - 2 sets - 1 min hold Sidelying Hip Abduction - 1 x daily - 7 x weekly - 2 sets - 10 reps - 3-sec hold Supine Bridge - 1 x daily - 7 x weekly - 3 sets - 10 reps - 3-sec hold     ASSESSMENT:   CLINICAL IMPRESSION: ***      GOALS: Goals reviewed with patient? Yes   SHORT TERM GOALS:   STG Name Target Date Goal status  1 Pt will report understanding and adherence to his HEP in order to promote independence in the management of his primary impairments. Baseline: HEP provided at eval 02/21/2021 INITIAL    LONG TERM GOALS:    LTG Name Target Date Goal status  1 Pt will report ability to walk 10 minutes with 0-5/10 pain in order  to grocery shop with less limitation. Baseline: Pt only able to walk about 100 meters before needing a rest due to pain. 03/21/2021 INITIAL  2 Pt will achieve Rt knee extension strength of 4+/5 or greater in order to promote increased post-op strength for rehabilitative purposes. Baseline: 3+/5 03/21/2021 INITIAL  3 Pt will demonstrate 5 weight-bearing squat limited by 25% or less with 0-5/10 pain in order to return to doing yard work with less limitation. Baseline: 50% limited squat with 8/10 pain 03/21/2021 INITIAL  4 Pt will achieve Rt knee extension AROM of -5 degrees or better in order to promote WNL gait for community ambulation Baseline: -12 degrees 03/21/2021 INITIAL    PLAN: PT FREQUENCY: 2x/week   PT DURATION: 8 weeks   PLANNED INTERVENTIONS: Therapeutic exercises, Therapeutic activity, Neuro Muscular re-education, Balance training, Gait training, Patient/Family education, Joint mobilization, Stair training, Aquatic Therapy, Dry Needling, Cryotherapy, Moist heat, Taping, and Manual therapy   PLAN FOR NEXT SESSION: Progress early quad/ hip strengthening, knee mobility    Ward Chatters, PT 01/28/2021, 9:15 AM

## 2021-02-02 ENCOUNTER — Other Ambulatory Visit: Payer: Self-pay

## 2021-02-02 ENCOUNTER — Ambulatory Visit: Payer: Medicaid Other

## 2021-02-02 DIAGNOSIS — M25561 Pain in right knee: Secondary | ICD-10-CM | POA: Diagnosis not present

## 2021-02-02 DIAGNOSIS — R2689 Other abnormalities of gait and mobility: Secondary | ICD-10-CM

## 2021-02-02 DIAGNOSIS — G8929 Other chronic pain: Secondary | ICD-10-CM

## 2021-02-02 NOTE — Therapy (Signed)
OUTPATIENT PHYSICAL THERAPY TREATMENT NOTE   Patient Name: Matthew Stark MRN: [id number] DOB:[birth year], 61 y.o., male Today's Date: 02/02/2021  PCP: No primary care provider on file. REFERRING PROVIDER: Willaim Sheng, MD   PT End of Session - 02/02/21 0940     Visit Number 3    Number of Visits 17    Date for PT Re-Evaluation 03/21/21    Authorization Type [employer]    Authorization Time Period 01/28/2021- 03/27/2021    Authorization - Visit Number 2    Authorization - Number of Visits 16    PT Start Time 0945    PT Stop Time 1025    PT Time Calculation (min) 40 min    Activity Tolerance Patient tolerated treatment well    Behavior During Therapy WFL for tasks assessed/performed              Past Medical History:  Diagnosis Date   Hypertension    History reviewed. No pertinent surgical history. There are no problems to display for this patient.   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 He has continued to be compliant with his HEP with no adverse effect.   PAIN:  Are you having pain? Yes NPRS scale: 4/10 Pain location: Knee Pain orientation: Right  PAIN TYPE: aching Pain description: intermittent    OBJECTIVE:    LE AROM/PROM:   A/PROM Right 01/24/2021 Left 01/24/2021  Knee flexion 125/127p! 140/145  Knee extension -12/ -8p! 0/ 3   (Blank rows = not tested)   LE MMT:   MMT Right 01/24/2021 Left 01/24/2021  Hip flexion 4+/5 5/5  Hip extension 3+/5 4/5  Hip abduction 4/5 4/5  Knee flexion 4+/5 5/5  Knee extension 3+/5 with pain 5/5   (Blank rows = not tested)      TODAY'S TREATMENT: [facility] Adult PT Treatment:                                                DATE:  02/02/2021 Therapeutic Exercise: NuStep lvl 5 UE/LE x 5 min while taking subjective Standing Cybex hip abduction 2x10 with 3-sec hold BIL 25# Standing Cybex hip extension 2x10 with 3-sec hold BIL with 25#  Seated double knee extension machine 2x10 with 3-sec lowering, 5# Bridge with ball adduction squeeze 3x10 with 3-sec hold Supine hamstring stretch with strap 2x30-sec ea Prone hamstring curls with 235# ankle weights 2x10 with 3-sec lowering Supine SLR 2x10 BIL 2.5# Supine clamshell GTB 3x15 Long-sitting calf stretch with sheet x1 min BIL STS from elevated table 2x10 Manual Therapy: N/A Neuromuscular re-ed: N/A Therapeutic Activity: N/A Modalities: N/A Self Care: N/A   [facility] Adult PT Treatment:                                                DATE: 01/28/2021 Therapeutic Exercise: Bridge with pillow adduction squeeze 3x10 with 3-sec hold Standing Cybex hip abduction 2x10 with 3-sec hold BIL with 25# cable Standing Cybex hip extension  2x10 with 3-sec hold BIL with 25# cable Seated double knee extension machine 2x10 with 3-sec lowering, 5# Prone hamstring curls with 2# ankle weights 3x8 with 3-sec lowering Supine SLR with hip abduction 2x10 BIL Long-sitting calf stretch with sheet x1 min BIL Manual Therapy: N/A Neuromuscular re-ed: N/A Therapeutic Activity: N/A Modalities: N/A Self Care: N/A   [facility] Adult PT Treatment:                                                DATE: 01/27/2021 Therapeutic Exercise: Seated active hamstring stretch on Rt x1 minute Supine bridge x10 with 3-sec hold Sidelying hip abduction x10 with 3-sec hold BIL Mini squat x10 Seated LAQ x10 with 3-sec hold on Rt Manual Therapy: N/A Neuromuscular re-ed: N/A Therapeutic Activity: N/A Modalities: N/A Self Care: N/A       PATIENT EDUCATION:  Education details: Educated on POC, prognosis, and HEP Person educated: Patient Education method: Consulting civil engineer, Media planner, and Handouts Education  comprehension: verbalized understanding and returned demonstration     HOME EXERCISE PROGRAM: Access Code: [email or web address] URL: [email or web address] Date: 01/24/2021 Prepared by: Vanessa Hilbert   Exercises Seated Long Arc Quad - 1 x daily - 7 x weekly - 3 sets - 10 reps - 3-sec hold Mini Squat with Counter Support - 1 x daily - 7 x weekly - 3 sets - 10 reps Seated Hamstring Stretch - 1 x daily - 7 x weekly - 2 sets - 1 min hold Sidelying Hip Abduction - 1 x daily - 7 x weekly - 2 sets - 10 reps - 3-sec hold Supine Bridge - 1 x daily - 7 x weekly - 3 sets - 10 reps - 3-sec hold     ASSESSMENT:   CLINICAL IMPRESSION: Patient was again able to complete prescribed exercises with no adverse effect or increase in pain. Therapy today focused on improving quad and proximal hip strength as well as improving functional mobility. He continues to demonstrate LE weakness and limited R knee ROM. Patient continues to benefit from skilled PT services working towards eventual TKA and will be progressed as tolerated.      GOALS: Goals reviewed with patient? Yes   SHORT TERM GOALS:   STG Name Target Date Goal status  1 Pt will report understanding and adherence to his HEP in order to promote independence in the management of his primary impairments. Baseline: HEP provided at eval 02/21/2021 INITIAL    LONG TERM GOALS:    LTG Name Target Date Goal status  1 Pt will report ability to walk 10 minutes with 0-5/10 pain in order to grocery shop with less limitation. Baseline: Pt only able to walk about 100 meters before needing a rest due to pain. 03/21/2021 INITIAL  2 Pt will achieve Rt knee extension strength of 4+/5 or greater in order to promote increased post-op strength for rehabilitative purposes. Baseline: 3+/5 03/21/2021 INITIAL  3 Pt will demonstrate 5 weight-bearing squat limited by 25% or less with 0-5/10 pain in order to return to doing yard work with less limitation. Baseline:  50% limited squat with 8/10 pain 03/21/2021 INITIAL  4 Pt will achieve Rt knee extension AROM of -5 degrees or better in order to promote WNL gait for community ambulation Baseline: -12 degrees 03/21/2021 INITIAL    PLAN: PT FREQUENCY: 2x/week   PT DURATION: 8 weeks   PLANNED  INTERVENTIONS: Therapeutic exercises, Therapeutic activity, Neuro Muscular re-education, Balance training, Gait training, Patient/Family education, Joint mobilization, Stair training, Aquatic Therapy, Dry Needling, Cryotherapy, Moist heat, Taping, and Manual therapy    Ward Chatters, PT 02/02/21 10:28 AM

## 2021-02-05 ENCOUNTER — Other Ambulatory Visit: Payer: Self-pay

## 2021-02-05 ENCOUNTER — Ambulatory Visit: Payer: Medicaid Other | Attending: Orthopedic Surgery

## 2021-02-05 DIAGNOSIS — M25661 Stiffness of right knee, not elsewhere classified: Secondary | ICD-10-CM | POA: Diagnosis present

## 2021-02-05 DIAGNOSIS — M25561 Pain in right knee: Secondary | ICD-10-CM | POA: Diagnosis not present

## 2021-02-05 DIAGNOSIS — G8929 Other chronic pain: Secondary | ICD-10-CM | POA: Insufficient documentation

## 2021-02-05 DIAGNOSIS — R2689 Other abnormalities of gait and mobility: Secondary | ICD-10-CM | POA: Insufficient documentation

## 2021-02-05 DIAGNOSIS — M6281 Muscle weakness (generalized): Secondary | ICD-10-CM | POA: Insufficient documentation

## 2021-02-05 NOTE — Therapy (Signed)
OUTPATIENT PHYSICAL THERAPY TREATMENT NOTE   Patient Name: Matthew Stark MRN: [id number] DOB:October 19, 1960, 61 y.o., male Today's Date: 02/05/2021  PCP: No primary care provider on file. REFERRING PROVIDER: Willaim Sheng, MD   PT End of Session - 02/05/21 1210     Visit Number 4    Number of Visits 17    Date for PT Re-Evaluation 03/21/21    Authorization Type [employer]    Authorization Time Period 01/28/2021- 03/27/2021    Authorization - Visit Number 4    Authorization - Number of Visits 16    PT Start Time 5462    PT Stop Time 1248    PT Time Calculation (min) 33 min    Activity Tolerance Patient tolerated treatment well    Behavior During Therapy WFL for tasks assessed/performed              Past Medical History:  Diagnosis Date   Hypertension    History reviewed. No pertinent surgical history. There are no problems to display for this patient.   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 He has been compliant with HEP with no adverse effect. Pt is ready to begin PT at this time.   PAIN:  Are you having pain? Yes NPRS scale: 5/10 Pain location: Knee Pain orientation: Right  PAIN TYPE: aching Pain description: intermittent    OBJECTIVE:    LE AROM/PROM:   A/PROM Right 01/24/2021 Right 02/05/2021  Knee flexion 125/127p! 140/145  Knee extension -12/ -8p! 12   (Blank rows = not tested)   LE MMT:   MMT Right 01/24/2021 Left 01/24/2021  Hip flexion 4+/5 5/5  Hip extension 3+/5 4/5  Hip abduction 4/5 4/5  Knee flexion 4+/5 5/5  Knee extension 3+/5 with pain 5/5   (Blank rows = not tested)      TODAY'S TREATMENT: [facility] Adult PT Treatment:                                                 DATE: 02/05/2021 Therapeutic Exercise: NuStep lvl 5 UE/LE x 5 min while taking subjective Standing Cybex hip abduction 2x10 with 3-sec hold BIL 30# Standing Cybex hip extension 2x10 with 3-sec hold BIL with 25#  Seated double knee extension machine 2x10 with 3-sec lowering, 5# Seated hamstring curl 2x10 25# Bridge with ball adduction squeeze 3x10 with 3-sec hold Supine hamstring stretch with strap 2x30-sec ea Supine SLR 2x10 BIL 2.5# Seated hamstring stretch 2x30" R STS from elevated table 2x10 Slant board stretch 2x30" Manual Therapy: N/A Neuromuscular re-ed: N/A Therapeutic Activity: N/A Modalities: N/A Self Care: N/A   [facility] Adult PT Treatment:                                                DATE: 02/02/2021 Therapeutic Exercise: NuStep lvl 5 UE/LE x 5 min while taking subjective Standing Cybex hip abduction 2x10 with 3-sec hold BIL 25# Standing Cybex hip extension 2x10 with  3-sec hold BIL with 25#  Seated double knee extension machine 2x10 with 3-sec lowering, 5# Bridge with ball adduction squeeze 3x10 with 3-sec hold Supine hamstring stretch with strap 2x30-sec ea Prone hamstring curls with 235# ankle weights 2x10 with 3-sec lowering Supine SLR 2x10 BIL 2.5# Supine clamshell GTB 3x15 Long-sitting calf stretch with sheet x1 min BIL STS from elevated table 2x10 Manual Therapy: N/A Neuromuscular re-ed: N/A Therapeutic Activity: N/A Modalities: N/A Self Care: N/A   [facility] Adult PT Treatment:                                                DATE: 01/28/2021 Therapeutic Exercise: Bridge with pillow adduction squeeze 3x10 with 3-sec hold Standing Cybex hip abduction 2x10 with 3-sec hold BIL with 25# cable Standing Cybex hip extension 2x10 with 3-sec hold BIL with 25# cable Seated double knee extension machine 2x10 with 3-sec lowering, 5# Prone hamstring curls with 2# ankle weights 3x8 with 3-sec lowering Supine SLR with hip abduction 2x10 BIL Long-sitting calf  stretch with sheet x1 min BIL Manual Therapy: N/A Neuromuscular re-ed: N/A Therapeutic Activity: N/A Modalities: N/A Self Care: N/A  PATIENT EDUCATION:  Education details: Educated on POC, prognosis, and HEP Person educated: Patient Education method: Consulting civil engineer, Media planner, and Handouts Education comprehension: verbalized understanding and returned demonstration     HOME EXERCISE PROGRAM: Access Code: [email or web address] URL: [email or web address] Date: 01/24/2021 Prepared by: Vanessa Lemitar   Exercises Seated Long Arc Quad - 1 x daily - 7 x weekly - 3 sets - 10 reps - 3-sec hold Mini Squat with Counter Support - 1 x daily - 7 x weekly - 3 sets - 10 reps Seated Hamstring Stretch - 1 x daily - 7 x weekly - 2 sets - 1 min hold Sidelying Hip Abduction - 1 x daily - 7 x weekly - 2 sets - 10 reps - 3-sec hold Supine Bridge - 1 x daily - 7 x weekly - 3 sets - 10 reps - 3-sec hold  Seated Hamstring Stretch - 1 x daily - 7 x weekly - 2-3 reps - 30-sec hold   ASSESSMENT:   CLINICAL IMPRESSION: Pt was able to complete prescribed exercises with no adverse effect or increase in pain. Therapy today continue to focus on improving quad and proximal hip strength, as well as R knee extension ROM to prepare for upcoming TKA. Schedule was changed to 1x/wk until his TKA surgery on 03/02/21 to conserve visits, with increased frequency after. Pt continues to benefit from skilled PT and should continue to be seen and progressed as able.      GOALS: Goals reviewed with patient? Yes   SHORT TERM GOALS:   STG Name Target Date Goal status  1 Pt will report understanding and adherence to his HEP in order to promote independence in the management of his primary impairments. Baseline: HEP provided at eval 02/21/2021 INITIAL    LONG TERM GOALS:    LTG Name Target Date Goal status  1 Pt will report ability to walk 10 minutes with 0-5/10 pain in order to grocery shop with less  limitation. Baseline: Pt only able to walk about 100 meters before needing a rest due to pain. 03/21/2021 INITIAL  2 Pt will achieve Rt knee extension strength of 4+/5 or greater in order to promote increased post-op strength for rehabilitative purposes. Baseline: 3+/5 03/21/2021  INITIAL  3 Pt will demonstrate 5 weight-bearing squat limited by 25% or less with 0-5/10 pain in order to return to doing yard work with less limitation. Baseline: 50% limited squat with 8/10 pain 03/21/2021 INITIAL  4 Pt will achieve Rt knee extension AROM of -5 degrees or better in order to promote WNL gait for community ambulation Baseline: -12 degrees 03/21/2021 INITIAL    PLAN: PT FREQUENCY: 2x/week   PT DURATION: 8 weeks   PLANNED INTERVENTIONS: Therapeutic exercises, Therapeutic activity, Neuro Muscular re-education, Balance training, Gait training, Patient/Family education, Joint mobilization, Stair training, Aquatic Therapy, Dry Needling, Cryotherapy, Moist heat, Taping, and Manual therapy    Ward Chatters, PT 02/05/21 12:51 PM

## 2021-02-09 NOTE — Progress Notes (Signed)
Surgery orders requested via Epic °

## 2021-02-10 ENCOUNTER — Ambulatory Visit: Payer: Medicaid Other

## 2021-02-11 NOTE — Patient Instructions (Addendum)
DUE TO COVID-19 ONLY ONE VISITOR IS ALLOWED TO COME WITH YOU AND STAY IN THE WAITING ROOM ONLY DURING PRE OP AND PROCEDURE.   **NO VISITORS ARE ALLOWED IN THE SHORT STAY AREA OR RECOVERY ROOM!!**   You are not required to quarantine, however you are required to wear a well-fitted mask when you are out and around people not in your household.  Hand Hygiene often Do NOT share personal items Notify your provider if you are in close contact with someone who has COVID or you develop fever 100.4 or greater, new onset of sneezing, cough, sore throat, shortness of breath or body aches.        Your procedure is scheduled on: Monday, 03-02-21   Report to [facility] Main  Entrance     Report to admitting at 5:15 AM   Call this number if you have problems the morning of surgery [phone]   Do not eat food :After Midnight.   May have liquids until  4:30 AM  day of surgery  CLEAR LIQUID DIET  Foods Allowed                                                                     Foods Excluded  Water, Black Coffee (no milk/no creamer) and tea, regular and decaf                              liquids that you cannot  Plain Jell-O in any flavor  (No red)                         see through such as: Fruit ices (not with fruit pulp)                                 milk, soups, orange juice  Iced Popsicles (No red)                                    All solid food                             Apple juices Sports drinks like Gatorade (No red) Lightly seasoned clear broth or consume(fat free) Sugar     Complete one Ensure drink the morning of surgery at  4:30 AM the day of surgery.      The day of surgery:  Drink ONE (1) Pre-Surgery Clear Ensure the morning of surgery. Drink in one sitting. Do not sip.  This drink was given to you during your hospital  pre-op appointment visit. Nothing else to drink after completing the Pre-Surgery Clear Ensure          If you have questions, please  contact your surgeons office.     Oral Hygiene is also important to reduce your risk of infection.  Remember - BRUSH YOUR TEETH THE MORNING OF SURGERY WITH YOUR REGULAR TOOTHPASTE   Do NOT smoke after Midnight   Take these medicines the morning of surgery with A SIP OF WATER:  Amlodipine   Stop all vitamins and herbal supplements a week before surgery.     Stop Motrin, Aleve, Ibuprofen a week before surgery.             You may not have any metal on your body including  jewelry, and body piercing             Do not wear  lotions, powders, cologne, or deodorant  Do not wear nail polish including gel and S&S, artificial/acrylic nails, or any other type of covering on natural nails including finger and toenails. If you               Men may shave face and neck.   Contacts, dentures or bridgework may not be worn into surgery.    Patients discharged the day of surgery will not be allowed to drive home.   A responsible adult must remain with you for 24 hours after surgery.  Please read over the following fact sheets you were given: IF YOU HAVE QUESTIONS ABOUT YOUR PRE OP INSTRUCTIONS PLEASE CALL [facility] - Preparing for Surgery Before surgery, you can play an important role.  Because skin is not sterile, your skin needs to be as free of germs as possible.  You can reduce the number of germs on your skin by washing with CHG (chlorahexidine gluconate) soap before surgery.  CHG is an antiseptic cleaner which kills germs and bonds with the skin to continue killing germs even after washing. Please DO NOT use if you have an allergy to CHG or antibacterial soaps.  If your skin becomes reddened/irritated stop using the CHG and inform your nurse when you arrive at Short Stay. Do not shave (including legs and underarms) for at least 48 hours prior to the first CHG shower.  You may shave your face/neck.  Please follow these instructions  carefully:  1.  Shower with CHG Soap the night before surgery and the  morning of surgery.  2.  If you choose to wash your hair, wash your hair first as usual with your normal  shampoo.  3.  After you shampoo, rinse your hair and body thoroughly to remove the shampoo.                             4.  Use CHG as you would any other liquid soap.  You can apply chg directly to the skin and wash.  Gently with a scrungie or clean washcloth.  5.  Apply the CHG Soap to your body ONLY FROM THE NECK DOWN.   Do   not use on face/ open                           Wound or open sores. Avoid contact with eyes, ears mouth and   genitals (private parts).                       Wash face,  Genitals (private parts) with your normal soap.             6.  Wash thoroughly, paying special attention to the area where your    surgery  will be performed.  7.  Thoroughly rinse your body with warm water from the neck down.  8.  DO NOT shower/wash with your normal soap after using and rinsing off the CHG Soap.                9.  Pat yourself dry with a clean towel.            10.  Wear clean pajamas.            11.  Place clean sheets on your bed the night of your first shower and do not  sleep with pets. Day of Surgery : Do not apply any lotions/deodorants the morning of surgery.  Please wear clean clothes to the hospital/surgery center.  FAILURE TO FOLLOW THESE INSTRUCTIONS MAY RESULT IN THE CANCELLATION OF YOUR SURGERY  PATIENT SIGNATURE_________________________________  NURSE SIGNATURE__________________________________  ________________________________________________________________________   Adam Phenix  An incentive spirometer is a tool that can help keep your lungs clear and active. This tool measures how well you are filling your lungs with each breath. Taking long deep breaths may help reverse or decrease the chance of developing breathing (pulmonary) problems (especially infection) following: A long  period of time when you are unable to move or be active. BEFORE THE PROCEDURE  If the spirometer includes an indicator to show your best effort, your nurse or respiratory therapist will set it to a desired goal. If possible, sit up straight or lean slightly forward. Try not to slouch. Hold the incentive spirometer in an upright position. INSTRUCTIONS FOR USE  Sit on the edge of your bed if possible, or sit up as far as you can in bed or on a chair. Hold the incentive spirometer in an upright position. Breathe out normally. Place the mouthpiece in your mouth and seal your lips tightly around it. Breathe in slowly and as deeply as possible, raising the piston or the ball toward the top of the column. Hold your breath for 3-5 seconds or for as long as possible. Allow the piston or ball to fall to the bottom of the column. Remove the mouthpiece from your mouth and breathe out normally. Rest for a few seconds and repeat Steps 1 through 7 at least 10 times every 1-2 hours when you are awake. Take your time and take a few normal breaths between deep breaths. The spirometer may include an indicator to show your best effort. Use the indicator as a goal to work toward during each repetition. After each set of 10 deep breaths, practice coughing to be sure your lungs are clear. If you have an incision (the cut made at the time of surgery), support your incision when coughing by placing a pillow or rolled up towels firmly against it. Once you are able to get out of bed, walk around indoors and cough well. You may stop using the incentive spirometer when instructed by your caregiver.  RISKS AND COMPLICATIONS Take your time so you do not get dizzy or light-headed. If you are in pain, you may need to take or ask for pain medication before doing incentive spirometry. It is harder to take a deep breath if you are having pain. AFTER USE Rest and breathe slowly and easily. It can be helpful to keep track of a log  of your progress. Your caregiver can provide you with a simple table to help with this. If you are using the spirometer at home, follow these instructions: [place] IF:  You  are having difficultly using the spirometer. You have trouble using the spirometer as often as instructed. Your pain medication is not giving enough relief while using the spirometer. You develop fever of 100.5 F (38.1 C) or higher. SEEK IMMEDIATE MEDICAL CARE IF:  You cough up bloody sputum that had not been present before. You develop fever of 102 F (38.9 C) or greater. You develop worsening pain at or near the incision site. MAKE SURE YOU:  Understand these instructions. Will watch your condition. Will get help right away if you are not doing well or get worse. Document Released: 05/03/2006 Document Revised: 03/15/2011 Document Reviewed: 07/04/2006 [facility] Patient Information 2014 [place], [place].   ________________________________________________________________________

## 2021-02-12 ENCOUNTER — Ambulatory Visit: Payer: Medicaid Other

## 2021-02-12 ENCOUNTER — Other Ambulatory Visit: Payer: Self-pay

## 2021-02-12 DIAGNOSIS — G8929 Other chronic pain: Secondary | ICD-10-CM

## 2021-02-12 DIAGNOSIS — M25561 Pain in right knee: Secondary | ICD-10-CM | POA: Diagnosis not present

## 2021-02-12 DIAGNOSIS — R2689 Other abnormalities of gait and mobility: Secondary | ICD-10-CM

## 2021-02-12 NOTE — Therapy (Signed)
OUTPATIENT PHYSICAL THERAPY TREATMENT NOTE   Patient Name: Matthew Stark MRN: [id number] DOB:[birth year], 61 y.o., male Today's Date: 02/12/2021  PCP: No primary care provider on file. REFERRING PROVIDER: Willaim Sheng, MD   PT End of Session - 02/12/21 1112     Visit Number 5    Number of Visits 17    Date for PT Re-Evaluation 03/21/21    Authorization Type [employer]    Authorization Time Period 01/28/2021- 03/27/2021    Authorization - Visit Number 5    Authorization - Number of Visits 16    PT Start Time 1113    PT Stop Time 1152    PT Time Calculation (min) 39 min    Activity Tolerance Patient tolerated treatment well    Behavior During Therapy WFL for tasks assessed/performed              Past Medical History:  Diagnosis Date   Hypertension    History reviewed. No pertinent surgical history. There are no problems to display for this patient.   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 Has been compliant with HEP with no adverse effect. Pt is ready to begin PT at this time.  PAIN:  Are you having pain? Yes NPRS scale: 2/10 Pain location: Knee Pain orientation: Right  PAIN TYPE: aching Pain description: intermittent    OBJECTIVE:    LE AROM/PROM:   A/PROM Right 01/24/2021 Right 02/05/2021  Knee flexion 125/127p! 140/145  Knee extension -12/ -8p! 12   (Blank rows = not tested)   LE MMT:   MMT Right 01/24/2021 Left 01/24/2021  Hip flexion 4+/5 5/5  Hip extension 3+/5 4/5  Hip abduction 4/5 4/5  Knee flexion 4+/5 5/5  Knee extension 3+/5 with pain 5/5   (Blank rows = not tested)      TODAY'S TREATMENT: [facility] Adult PT Treatment:                                                DATE: 02/12/2021 Therapeutic  Exercise: NuStep lvl 5 UE/LE x 5 min while taking subjective Standing Cybex hip abduction 3x10 with 3-sec hold BIL 30# Standing Cybex hip extension 3x10 with 3-sec hold BIL with 30#  Seated double knee extension machine 3x10 with 3-sec lowering, 5# Seated hamstring curl 3x10 25# Supine hamstring stretch with strap x 60" R Supine SLR 2x10 BIL 2.5# Prone hamstring curl 2x10 2.5# each Seated hamstring stretch 2x45" R STS from elevated table 2x10 Slant board stretch 2x30" Manual Therapy: N/A Neuromuscular re-ed: N/A Therapeutic Activity: N/A Modalities: N/A Self Care:  [facility] Adult PT Treatment:                                                DATE: 02/05/2021 Therapeutic Exercise: NuStep lvl 5 UE/LE x 5 min while taking subjective Standing Cybex hip abduction 2x10 with 3-sec hold BIL 30# Standing Cybex hip extension 2x10 with 3-sec hold BIL with 25#  Seated double knee extension machine  2x10 with 3-sec lowering, 5# Seated hamstring curl 2x10 25# Bridge with ball adduction squeeze 3x10 with 3-sec hold Supine hamstring stretch with strap 2x30-sec ea Supine SLR 2x10 BIL 2.5# Seated hamstring stretch 2x30" R STS from elevated table 2x10 Slant board stretch 2x30" Manual Therapy: N/A Neuromuscular re-ed: N/A Therapeutic Activity: N/A Modalities: N/A Self Care: N/A   PATIENT EDUCATION:  Education details: Educated on POC, prognosis, and HEP Person educated: Patient Education method: Consulting civil engineer, Media planner, and Handouts Education comprehension: verbalized understanding and returned demonstration     HOME EXERCISE PROGRAM: Access Code: [email or web address] URL: [email or web address] Date: 01/24/2021 Prepared by: Vanessa Menard   Exercises Seated Long Arc Quad - 1 x daily - 7 x weekly - 3 sets - 10 reps - 3-sec hold Mini Squat with Counter Support - 1 x daily - 7 x weekly - 3 sets - 10 reps Seated Hamstring Stretch - 1 x daily - 7 x weekly - 2 sets - 1 min  hold Sidelying Hip Abduction - 1 x daily - 7 x weekly - 2 sets - 10 reps - 3-sec hold Supine Bridge - 1 x daily - 7 x weekly - 3 sets - 10 reps - 3-sec hold  Seated Hamstring Stretch - 1 x daily - 7 x weekly - 2-3 reps - 30-sec hold   ASSESSMENT:   CLINICAL IMPRESSION: Pt was able to complete prescribed exercises with no adverse effect. Therapy today focused on improving quad, hamstring, and proximal hip strengthening along with improving R knee ROM for preparation of upcoming TKA. PT will continue to progress exercises as tolerated per POC.      GOALS: Goals reviewed with patient? Yes   SHORT TERM GOALS:   STG Name Target Date Goal status  1 Pt will report understanding and adherence to his HEP in order to promote independence in the management of his primary impairments. Baseline: HEP provided at eval 02/21/2021 INITIAL    LONG TERM GOALS:    LTG Name Target Date Goal status  1 Pt will report ability to walk 10 minutes with 0-5/10 pain in order to grocery shop with less limitation. Baseline: Pt only able to walk about 100 meters before needing a rest due to pain. 03/21/2021 INITIAL  2 Pt will achieve Rt knee extension strength of 4+/5 or greater in order to promote increased post-op strength for rehabilitative purposes. Baseline: 3+/5 03/21/2021 INITIAL  3 Pt will demonstrate 5 weight-bearing squat limited by 25% or less with 0-5/10 pain in order to return to doing yard work with less limitation. Baseline: 50% limited squat with 8/10 pain 03/21/2021 INITIAL  4 Pt will achieve Rt knee extension AROM of -5 degrees or better in order to promote WNL gait for community ambulation Baseline: -12 degrees 03/21/2021 INITIAL    PLAN: PT FREQUENCY: 2x/week   PT DURATION: 8 weeks   PLANNED INTERVENTIONS: Therapeutic exercises, Therapeutic activity, Neuro Muscular re-education, Balance training, Gait training, Patient/Family education, Joint mobilization, Stair training, Aquatic Therapy, Dry  Needling, Cryotherapy, Moist heat, Taping, and Manual therapy    Ward Chatters, PT 02/12/21 11:52 AM

## 2021-02-16 ENCOUNTER — Ambulatory Visit: Payer: Self-pay | Admitting: Physician Assistant

## 2021-02-16 DIAGNOSIS — G8929 Other chronic pain: Secondary | ICD-10-CM

## 2021-02-16 DIAGNOSIS — M1711 Unilateral primary osteoarthritis, right knee: Secondary | ICD-10-CM

## 2021-02-16 DIAGNOSIS — M25561 Pain in right knee: Secondary | ICD-10-CM

## 2021-02-17 ENCOUNTER — Encounter (HOSPITAL_COMMUNITY)
Admission: RE | Admit: 2021-02-17 | Discharge: 2021-02-17 | Disposition: A | Discharge status: Home / Self Care | Payer: Medicaid Other | Source: Ambulatory Visit | Attending: Orthopedic Surgery | Admitting: Orthopedic Surgery

## 2021-02-17 ENCOUNTER — Other Ambulatory Visit: Payer: Self-pay

## 2021-02-17 ENCOUNTER — Encounter (HOSPITAL_COMMUNITY): Payer: Self-pay

## 2021-02-17 VITALS — BP 166/85 | HR 73 | Temp 97.5°F | Resp 18 | Ht 67.0 in | Wt 194.0 lb

## 2021-02-17 DIAGNOSIS — I2510 Atherosclerotic heart disease of native coronary artery without angina pectoris: Secondary | ICD-10-CM | POA: Diagnosis not present

## 2021-02-17 DIAGNOSIS — M1711 Unilateral primary osteoarthritis, right knee: Secondary | ICD-10-CM | POA: Insufficient documentation

## 2021-02-17 DIAGNOSIS — M25561 Pain in right knee: Secondary | ICD-10-CM | POA: Insufficient documentation

## 2021-02-17 DIAGNOSIS — G8929 Other chronic pain: Secondary | ICD-10-CM | POA: Diagnosis not present

## 2021-02-17 DIAGNOSIS — Z01818 Encounter for other preprocedural examination: Secondary | ICD-10-CM | POA: Diagnosis present

## 2021-02-17 HISTORY — DX: Unspecified osteoarthritis, unspecified site: M19.90

## 2021-02-17 LAB — TYPE AND SCREEN
ABO/RH(D): O POS
Antibody Screen: NEGATIVE

## 2021-02-17 LAB — CBC WITH DIFFERENTIAL/PLATELET
Abs Immature Granulocytes: 0.01 10*3/uL (ref 0.00–0.07)
Basophils Absolute: 0 10*3/uL (ref 0.0–0.1)
Basophils Relative: 1 %
Eosinophils Absolute: 0.3 10*3/uL (ref 0.0–0.5)
Eosinophils Relative: 5 %
HCT: 47.2 % (ref 39.0–52.0)
Hemoglobin: 15.6 g/dL (ref 13.0–17.0)
Immature Granulocytes: 0 %
Lymphocytes Relative: 41 %
Lymphs Abs: 2.2 10*3/uL (ref 0.7–4.0)
MCH: 28.5 pg (ref 26.0–34.0)
MCHC: 33.1 g/dL (ref 30.0–36.0)
MCV: 86.1 fL (ref 80.0–100.0)
Monocytes Absolute: 0.6 10*3/uL (ref 0.1–1.0)
Monocytes Relative: 11 %
Neutro Abs: 2.2 10*3/uL (ref 1.7–7.7)
Neutrophils Relative %: 42 %
Platelets: 318 10*3/uL (ref 150–400)
RBC: 5.48 MIL/uL (ref 4.22–5.81)
RDW: 13.2 % (ref 11.5–15.5)
WBC: 5.2 10*3/uL (ref 4.0–10.5)
nRBC: 0 % (ref 0.0–0.2)

## 2021-02-17 LAB — SURGICAL PCR SCREEN
MRSA, PCR: NEGATIVE
Staphylococcus aureus: NEGATIVE

## 2021-02-17 LAB — COMPREHENSIVE METABOLIC PANEL
ALT: 34 U/L (ref 0–44)
AST: 22 U/L (ref 15–41)
Albumin: 4.1 g/dL (ref 3.5–5.0)
Alkaline Phosphatase: 59 U/L (ref 38–126)
Anion gap: 6 (ref 5–15)
BUN: 11 mg/dL (ref 6–20)
CO2: 29 mmol/L (ref 22–32)
Calcium: 8.9 mg/dL (ref 8.9–10.3)
Chloride: 103 mmol/L (ref 98–111)
Creatinine, Ser: 0.83 mg/dL (ref 0.61–1.24)
GFR, Estimated: >60 mL/min (ref >60)
Glucose, Bld: 97 mg/dL (ref 70–99)
Potassium: 3.3 mmol/L — ABNORMAL LOW (ref 3.5–5.1)
Sodium: 138 mmol/L (ref 135–145)
Total Bilirubin: 0.9 mg/dL (ref 0.3–1.2)
Total Protein: 7.7 g/dL (ref 6.5–8.1)

## 2021-02-17 NOTE — Progress Notes (Signed)
COVID swab appointment:N/A  COVID Vaccine Completed:  Yes x2 Date COVID Vaccine completed:  12-31-19 01-28-20 Has received booster: COVID vaccine manufacturer:     Moderna     Date of COVID positive in last 90 days: No  PCP - [facility] Cardiologist - N/A  Chest x-ray -  N/A EKG - 02-17-21 Epic Stress Test -  N/A ECHO -  N/A Cardiac Cath -  N/A Pacemaker/ICD device last checked: Spinal Cord Stimulator:  Bowel Prep -  N/A  Sleep Study -  N/A CPAP -  N/A  Fasting Blood Sugar -  N/A Checks Blood Sugar _____ times a day  Blood Thinner Instructions: N/A Aspirin Instructions: Last Dose:  Activity level:   Can go up a flight of stairs and perform activities of daily living without stopping and without symptoms of chest pain or shortness of breath.    Anesthesia review:  N/A  Patient denies shortness of breath, fever, cough and chest pain at PAT appointment   Patient verbalized understanding of instructions that were given to them at the PAT appointment. Patient was also instructed that they will need to review over the PAT instructions again at home before surgery.

## 2021-02-18 ENCOUNTER — Ambulatory Visit: Payer: Self-pay | Admitting: Physician Assistant

## 2021-02-18 NOTE — H&P (View-Only) (Signed)
TOTAL KNEE ADMISSION H&P  Patient is being admitted for right total knee arthroplasty.  Subjective:  Chief Complaint:right knee pain.  HPI: Matthew Stark, 61 y.o. male, has a history of pain and functional disability in the right knee due to arthritis and has failed non-surgical conservative treatments for greater than 12 weeks to includeNSAID's and/or analgesics, corticosteriod injections, supervised PT with diminished ADL's post treatment, and activity modification.  Onset of symptoms was gradual, starting 8 years ago with gradually worsening course since that time. The patient noted no past surgery on the right knee(s).  Patient currently rates pain in the right knee(s) at 8 out of 10 with activity. Patient has night pain, worsening of pain with activity and weight bearing, pain that interferes with activities of daily living, pain with passive range of motion, crepitus, and joint swelling.  Patient has evidence of periarticular osteophytes and joint space narrowing by imaging studies. There is no active infection.  There are no problems to display for this patient.  Past Medical History:  Diagnosis Date   Arthritis    Hypertension     Past Surgical History:  Procedure Laterality Date   MOUTH SURGERY      Current Outpatient Medications  Medication Sig Dispense Refill Last Dose   amLODipine (NORVASC) 10 MG tablet Take 10 mg by mouth daily.      No current facility-administered medications for this visit.   Allergies  Allergen Reactions   Shrimp (Diagnostic) Itching    Social History   Tobacco Use   Smoking status: Never   Smokeless tobacco: Never  Substance Use Topics   Alcohol use: No    No family history on file.   Review of Systems  Gastrointestinal:  Positive for nausea and vomiting.  Genitourinary:  Positive for dysuria, frequency and hematuria.  Musculoskeletal:  Positive for arthralgias.  Skin:  Positive for rash.  Neurological:  Positive for headaches.  All  other systems reviewed and are negative.  Objective:  Physical Exam Constitutional:      General: He is not in acute distress.    Appearance: Normal appearance.  HENT:     Head: Normocephalic and atraumatic.  Eyes:     Extraocular Movements: Extraocular movements intact.     Pupils: Pupils are equal, round, and reactive to light.  Cardiovascular:     Rate and Rhythm: Normal rate and regular rhythm.     Pulses: Normal pulses.     Heart sounds: Normal heart sounds.  Pulmonary:     Effort: Pulmonary effort is normal. No respiratory distress.     Breath sounds: Normal breath sounds.  Abdominal:     General: Abdomen is flat. Bowel sounds are normal. There is no distension.     Palpations: Abdomen is soft.     Tenderness: There is no abdominal tenderness.  Musculoskeletal:     Cervical back: Normal range of motion and neck supple.     Comments: Examination of the right lower extremity again shows he is neurovascularly intact.  Good flexion and extension with the knee.  He does have joint line tenderness, more so medially.  Stable to varus and valgus.  Intact dorsiflexion and plantarflexion with the ankle.  There is no calf tenderness to palpation.    Lymphadenopathy:     Cervical: No cervical adenopathy.  Skin:    General: Skin is warm and dry.     Findings: No erythema or rash.  Neurological:     General: No focal deficit present.  Mental Status: He is alert and oriented to person, place, and time.  Psychiatric:        Mood and Affect: Mood normal.        Behavior: Behavior normal.    Vital signs in last 24 hours: @VSRANGES @  Labs:   Estimated body mass index is 30.38 kg/m as calculated from the following:   Height as of 02/17/21: 5\' 7"  (1.702 m).   Weight as of 02/17/21: 88 kg.   Imaging Review Plain radiographs demonstrate moderate degenerative joint disease of the right knee(s). The overall alignment ismild varus. The bone quality appears to be good for age and  reported activity level.      Assessment/Plan:  End stage arthritis, right knee   The patient history, physical examination, clinical judgment of the provider and imaging studies are consistent with end stage degenerative joint disease of the right knee(s) and total knee arthroplasty is deemed medically necessary. The treatment options including medical management, injection therapy arthroscopy and arthroplasty were discussed at length. The risks and benefits of total knee arthroplasty were presented and reviewed. The risks due to aseptic loosening, infection, stiffness, patella tracking problems, thromboembolic complications and other imponderables were discussed. The patient acknowledged the explanation, agreed to proceed with the plan and consent was signed. Patient is being admitted for inpatient treatment for surgery, pain control, PT, OT, prophylactic antibiotics, VTE prophylaxis, progressive ambulation and ADL's and discharge planning. The patient is planning to be discharged  home with outpt PT    Anticipated LOS equal to or greater than 2 midnights due to - Age 69 and older with one or more of the following:  - Obesity  - Expected need for hospital services (PT, OT, Nursing) required for safe  discharge  - Anticipated need for postoperative skilled nursing care or inpatient rehab  - Active co-morbidities: None OR   - Unanticipated findings during/Post Surgery: None  - Patient is a high risk of re-admission due to: None

## 2021-02-18 NOTE — H&P (Signed)
TOTAL KNEE ADMISSION H&P  Patient is being admitted for right total knee arthroplasty.  Subjective:  Chief Complaint:right knee pain.  HPI: Matthew Stark, 61 y.o. male, has a history of pain and functional disability in the right knee due to arthritis and has failed non-surgical conservative treatments for greater than 12 weeks to includeNSAID's and/or analgesics, corticosteriod injections, supervised PT with diminished ADL's post treatment, and activity modification.  Onset of symptoms was gradual, starting 8 years ago with gradually worsening course since that time. The patient noted no past surgery on the right knee(s).  Patient currently rates pain in the right knee(s) at 8 out of 10 with activity. Patient has night pain, worsening of pain with activity and weight bearing, pain that interferes with activities of daily living, pain with passive range of motion, crepitus, and joint swelling.  Patient has evidence of periarticular osteophytes and joint space narrowing by imaging studies. There is no active infection.  There are no problems to display for this patient.  Past Medical History:  Diagnosis Date   Arthritis    Hypertension     Past Surgical History:  Procedure Laterality Date   MOUTH SURGERY      Current Outpatient Medications  Medication Sig Dispense Refill Last Dose   amLODipine (NORVASC) 10 MG tablet Take 10 mg by mouth daily.      No current facility-administered medications for this visit.   Allergies  Allergen Reactions   Shrimp (Diagnostic) Itching    Social History   Tobacco Use   Smoking status: Never   Smokeless tobacco: Never  Substance Use Topics   Alcohol use: No    No family history on file.   Review of Systems  Gastrointestinal:  Positive for nausea and vomiting.  Genitourinary:  Positive for dysuria, frequency and hematuria.  Musculoskeletal:  Positive for arthralgias.  Skin:  Positive for rash.  Neurological:  Positive for headaches.  All  other systems reviewed and are negative.  Objective:  Physical Exam Constitutional:      General: He is not in acute distress.    Appearance: Normal appearance.  HENT:     Head: Normocephalic and atraumatic.  Eyes:     Extraocular Movements: Extraocular movements intact.     Pupils: Pupils are equal, round, and reactive to light.  Cardiovascular:     Rate and Rhythm: Normal rate and regular rhythm.     Pulses: Normal pulses.     Heart sounds: Normal heart sounds.  Pulmonary:     Effort: Pulmonary effort is normal. No respiratory distress.     Breath sounds: Normal breath sounds.  Abdominal:     General: Abdomen is flat. Bowel sounds are normal. There is no distension.     Palpations: Abdomen is soft.     Tenderness: There is no abdominal tenderness.  Musculoskeletal:     Cervical back: Normal range of motion and neck supple.     Comments: Examination of the right lower extremity again shows he is neurovascularly intact.  Good flexion and extension with the knee.  He does have joint line tenderness, more so medially.  Stable to varus and valgus.  Intact dorsiflexion and plantarflexion with the ankle.  There is no calf tenderness to palpation.    Lymphadenopathy:     Cervical: No cervical adenopathy.  Skin:    General: Skin is warm and dry.     Findings: No erythema or rash.  Neurological:     General: No focal deficit present.  Mental Status: He is alert and oriented to person, place, and time.  Psychiatric:        Mood and Affect: Mood normal.        Behavior: Behavior normal.    Vital signs in last 24 hours: @VSRANGES @  Labs:   Estimated body mass index is 30.38 kg/m as calculated from the following:   Height as of 02/17/21: 5\' 7"  (1.702 m).   Weight as of 02/17/21: 88 kg.   Imaging Review Plain radiographs demonstrate moderate degenerative joint disease of the right knee(s). The overall alignment ismild varus. The bone quality appears to be good for age and  reported activity level.      Assessment/Plan:  End stage arthritis, right knee   The patient history, physical examination, clinical judgment of the provider and imaging studies are consistent with end stage degenerative joint disease of the right knee(s) and total knee arthroplasty is deemed medically necessary. The treatment options including medical management, injection therapy arthroscopy and arthroplasty were discussed at length. The risks and benefits of total knee arthroplasty were presented and reviewed. The risks due to aseptic loosening, infection, stiffness, patella tracking problems, thromboembolic complications and other imponderables were discussed. The patient acknowledged the explanation, agreed to proceed with the plan and consent was signed. Patient is being admitted for inpatient treatment for surgery, pain control, PT, OT, prophylactic antibiotics, VTE prophylaxis, progressive ambulation and ADL's and discharge planning. The patient is planning to be discharged  home with outpt PT    Anticipated LOS equal to or greater than 2 midnights due to - Age 57 and older with one or more of the following:  - Obesity  - Expected need for hospital services (PT, OT, Nursing) required for safe  discharge  - Anticipated need for postoperative skilled nursing care or inpatient rehab  - Active co-morbidities: None OR   - Unanticipated findings during/Post Surgery: None  - Patient is a high risk of re-admission due to: None

## 2021-02-18 NOTE — Therapy (Signed)
OUTPATIENT PHYSICAL THERAPY TREATMENT NOTE   Patient Name: Matthew Stark MRN: [id number] DOB:1960-10-02, 61 y.o., male Today's Date: 02/19/2021  PCP: [facility] REFERRING PROVIDER: Willaim Sheng, MD   PT End of Session - 02/19/21 1300     Visit Number 6    Number of Visits 17    Date for PT Re-Evaluation 03/21/21    Authorization Type [employer]    Authorization Time Period 01/28/2021- 03/27/2021    Authorization - Visit Number 6    Authorization - Number of Visits 16    PT Start Time 1300    PT Stop Time 1350   10 minutes cryotherapy   PT Time Calculation (min) 50 min    Activity Tolerance Patient tolerated treatment well    Behavior During Therapy WFL for tasks assessed/performed               Past Medical History:  Diagnosis Date   Arthritis    Hypertension    Past Surgical History:  Procedure Laterality Date   MOUTH SURGERY     There are no problems to display for this patient.   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 He adds that his home exercises have been going well.  PAIN:  Are you having pain? Yes NPRS scale: 1-2/10 Pain location: Knee Pain orientation: Right  PAIN TYPE: aching Pain description: intermittent    OBJECTIVE:   *Unless otherwise noted, objective information collected previously* LE AROM/PROM:   A/PROM Right 01/24/2021 Right 02/05/2021  Knee flexion 125/127p! 140/145  Knee extension -12/ -8p! 12   (Blank rows = not tested)   LE MMT:   MMT Right 01/24/2021 Left 01/24/2021 Right 02/19/2021  Hip flexion 4+/5 5/5   Hip extension 3+/5 4/5   Hip abduction 4/5 4/5   Knee flexion 4+/5 5/5 5/5  Knee extension 3+/5 with pain 5/5 4/5 with pain   (Blank rows = not  tested)      TODAY'S TREATMENT:  [facility] Adult PT Treatment:                                                DATE: 02/19/2021 Therapeutic Exercise: Prone hamstring curl 2x10 2.5# each Seated double leg knee extension machine with 10# 3x8 Forward lunge on each side of BOSU ball 2x10 BIL on each side of ball Functional squats 3x10 Slant board gastroc stretch x2 minutes Standing Cybex hip abduction 2x10 with 3-sec hold BIL 30# Standing Cybex hip extension 2x10 with 3-sec hold BIL with 30#  Manual Therapy: N/A Neuromuscular re-ed: N/A Therapeutic Activity: N/A Modalities: Ice pack to Rt knee x10 minutes in supine with Rt LE elevated, no adverse effects noted Self Care: N/A   [facility] Adult PT Treatment:                                                DATE: 02/12/2021 Therapeutic Exercise: NuStep lvl 5 UE/LE x 5 min while taking subjective Standing Cybex hip abduction 3x10  with 3-sec hold BIL 30# Standing Cybex hip extension 3x10 with 3-sec hold BIL with 30#  Seated double knee extension machine 3x10 with 3-sec lowering, 5# Seated hamstring curl 3x10 25# Supine hamstring stretch with strap x 60" R Supine SLR 2x10 BIL 2.5# Prone hamstring curl 2x10 2.5# each Seated hamstring stretch 2x45" R STS from elevated table 2x10 Slant board stretch 2x30" Manual Therapy: N/A Neuromuscular re-ed: N/A Therapeutic Activity: N/A Modalities: N/A Self Care:  [facility] Adult PT Treatment:                                                DATE: 02/05/2021 Therapeutic Exercise: NuStep lvl 5 UE/LE x 5 min while taking subjective Standing Cybex hip abduction 2x10 with 3-sec hold BIL 30# Standing Cybex hip extension 2x10 with 3-sec hold BIL with 25#  Seated double knee extension machine 2x10 with 3-sec lowering, 5# Seated hamstring curl 2x10 25# Bridge with ball adduction squeeze 3x10 with 3-sec hold Supine hamstring stretch with strap 2x30-sec ea Supine SLR 2x10 BIL 2.5# Seated hamstring stretch 2x30"  R STS from elevated table 2x10 Slant board stretch 2x30" Manual Therapy: N/A Neuromuscular re-ed: N/A Therapeutic Activity: N/A Modalities: N/A Self Care: N/A   PATIENT EDUCATION:  Education details: Educated on POC, prognosis, and HEP Person educated: Patient Education method: Consulting civil engineer, Media planner, and Handouts Education comprehension: verbalized understanding and returned demonstration     HOME EXERCISE PROGRAM: Access Code: [email or web address] URL: [email or web address] Date: 01/24/2021 Prepared by: Vanessa [place]   Exercises Seated Long Arc Quad - 1 x daily - 7 x weekly - 3 sets - 10 reps - 3-sec hold Mini Squat with Counter Support - 1 x daily - 7 x weekly - 3 sets - 10 reps Seated Hamstring Stretch - 1 x daily - 7 x weekly - 2 sets - 1 min hold Sidelying Hip Abduction - 1 x daily - 7 x weekly - 2 sets - 10 reps - 3-sec hold Supine Bridge - 1 x daily - 7 x weekly - 3 sets - 10 reps - 3-sec hold  Seated Hamstring Stretch - 1 x daily - 7 x weekly - 2-3 reps - 30-sec hold   ASSESSMENT:   CLINICAL IMPRESSION: Pt responded well to all interventions today, demonstrating good form and no increase in pain with selected exercises. He has made excellent progress in his functional ability to squat and has also made some progress in Rt knee strength. He will continue to benefit from skilled PT to address his primary impairments and return to his prior level of function with less limitation.     GOALS: Goals reviewed with patient? Yes   SHORT TERM GOALS:   STG Name Target Date Goal status  1 Pt will report understanding and adherence to his HEP in order to promote independence in the management of his primary impairments. Baseline: HEP provided at eval 02/19/2021: Pt reports daily adherence to his HEP 02/21/2021 ACHIEVED    LONG TERM GOALS:    LTG Name Target Date Goal status  1 Pt will report ability to walk 10 minutes with 0-5/10 pain in order to grocery  shop with less limitation. Baseline: Pt only able to walk about 100 meters before needing a rest due to pain. 02/19/2021: Pt reports ability to walk 30 minutes with 5/10 pain 03/21/2021 ACHIEVED  2 Pt will achieve Rt knee  extension strength of 4+/5 or greater in order to promote increased post-op strength for rehabilitative purposes. Baseline: 3+/5 02/19/2021: 4/5 03/21/2021 Progressing  3 Pt will demonstrate 5 weight-bearing squat limited by 25% or less with 0-5/10 pain in order to return to doing yard work with less limitation. Baseline: 50% limited squat with 8/10 pain 02/19/2021: 3x10 full depth squat with 2/10 pain 03/21/2021 ACHIEVED  4 Pt will achieve Rt knee extension AROM of -5 degrees or better in order to promote WNL gait for community ambulation Baseline: -12 degrees 03/21/2021 INITIAL    PLAN: PT FREQUENCY: 2x/week   PT DURATION: 8 weeks   PLANNED INTERVENTIONS: Therapeutic exercises, Therapeutic activity, Neuro Muscular re-education, Balance training, Gait training, Patient/Family education, Joint mobilization, Stair training, Aquatic Therapy, Dry Needling, Cryotherapy, Moist heat, Taping, and Manual therapy    Vanessa [place], PT, DPT 02/19/21 1:50 PM

## 2021-02-19 ENCOUNTER — Ambulatory Visit: Payer: Medicaid Other

## 2021-02-19 ENCOUNTER — Other Ambulatory Visit: Payer: Self-pay

## 2021-02-19 DIAGNOSIS — G8929 Other chronic pain: Secondary | ICD-10-CM

## 2021-02-19 DIAGNOSIS — M6281 Muscle weakness (generalized): Secondary | ICD-10-CM

## 2021-02-19 DIAGNOSIS — M25561 Pain in right knee: Secondary | ICD-10-CM | POA: Diagnosis not present

## 2021-02-19 DIAGNOSIS — R2689 Other abnormalities of gait and mobility: Secondary | ICD-10-CM

## 2021-02-25 NOTE — Therapy (Signed)
OUTPATIENT PHYSICAL THERAPY TREATMENT NOTE   Patient Name: Matthew Stark MRN: [id number] DOB:14-May-1960, 61 y.o., male Today's Date: 02/26/2021  [facility]: [facility] REFERRING PROVIDER: Willaim Sheng, MD   PT End of Session - 02/26/21 1308     Visit Number 7    Number of Visits 17    Date for PT Re-Evaluation 03/21/21    Authorization Type [employer]    Authorization Time Period 01/28/2021- 03/27/2021    Authorization - Visit Number 7    Authorization - Number of Visits 16    PT Start Time 1302    PT Stop Time 1350   10 minutes cryotherapy   PT Time Calculation (min) 48 min    Activity Tolerance Patient tolerated treatment well    Behavior During Therapy WFL for tasks assessed/performed                Past Medical History:  Diagnosis Date   Arthritis    Hypertension    Past Surgical History:  Procedure Laterality Date   MOUTH SURGERY     There are no problems to display for this patient.   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 He reports that he is nervous about his surgery next Monday and also confirmed his POC in therapy following surgery.  PAIN:  Are you having pain? Yes NPRS scale: 1-2/10 Pain location: Knee Pain orientation: Right  PAIN TYPE: aching Pain description: intermittent    OBJECTIVE:   *Unless otherwise noted, objective information collected previously* LE AROM/PROM:   A/PROM Right 01/24/2021 Right 02/05/2021  Knee flexion 125/127p! 140/145  Knee extension -12/ -8p! 12   (Blank rows = not tested)   LE MMT:   MMT Right 01/24/2021 Left 01/24/2021 Right 02/19/2021  Hip flexion 4+/5 5/5   Hip extension  3+/5 4/5   Hip abduction 4/5 4/5   Knee flexion 4+/5 5/5 5/5  Knee extension 3+/5 with pain 5/5 4/5 with pain   (Blank rows = not tested)      TODAY'S TREATMENT:  [facility] Adult PT Treatment:                                                DATE: 02/26/2021 Therapeutic Exercise: Forward lunge on each side of BOSU ball 2x10 BIL on each side of ball Seated double leg knee extension machine with 5# 3x8 Dead lifts with two 10# kettlebells 3x8 Standing hamstring stretch 2x1 min 4-inch step up with knee drive and slow, eccentric retro step down 2x10 BIL Manual Therapy: N/A Neuromuscular re-ed: N/A Therapeutic Activity: N/A Modalities: Ice pack to Rt knee x10 minutes in supine with Rt LE elevated, no adverse effects noted Self Care: N/A   [facility] Adult PT Treatment:                                                DATE: 02/19/2021 Therapeutic Exercise: Prone hamstring curl 2x10  2.5# each Seated double leg knee extension machine with 10# 3x8 Forward lunge on each side of BOSU ball 2x10 BIL on each side of ball Functional squats 3x10 Slant board gastroc stretch x2 minutes Standing Cybex hip abduction 2x10 with 3-sec hold BIL 30# Standing Cybex hip extension 2x10 with 3-sec hold BIL with 30#  Manual Therapy: N/A Neuromuscular re-ed: N/A Therapeutic Activity: N/A Modalities: Ice pack to Rt knee x10 minutes in supine with Rt LE elevated, no adverse effects noted Self Care: N/A   [facility] Adult PT Treatment:                                                DATE: 02/12/2021 Therapeutic Exercise: NuStep lvl 5 UE/LE x 5 min while taking subjective Standing Cybex hip abduction 3x10 with 3-sec hold BIL 30# Standing Cybex hip extension 3x10 with 3-sec hold BIL with 30#  Seated double knee extension machine 3x10 with 3-sec lowering, 5# Seated hamstring curl 3x10 25# Supine hamstring stretch with strap x 60" R Supine SLR 2x10 BIL 2.5# Prone hamstring curl 2x10 2.5# each Seated hamstring stretch  2x45" R STS from elevated table 2x10 Slant board stretch 2x30" Manual Therapy: N/A Neuromuscular re-ed: N/A Therapeutic Activity: N/A Modalities: N/A Self Care:     PATIENT EDUCATION:  Education details: Educated on POC, prognosis, and HEP Person educated: Patient Education method: Consulting civil engineer, Media planner, and Handouts Education comprehension: verbalized understanding and returned demonstration     HOME EXERCISE PROGRAM: Access Code: [email or web address] URL: [email or web address] Date: 01/24/2021 Prepared by: Vanessa [place]   Exercises Seated Long Arc Quad - 1 x daily - 7 x weekly - 3 sets - 10 reps - 3-sec hold Mini Squat with Counter Support - 1 x daily - 7 x weekly - 3 sets - 10 reps Seated Hamstring Stretch - 1 x daily - 7 x weekly - 2 sets - 1 min hold Sidelying Hip Abduction - 1 x daily - 7 x weekly - 2 sets - 10 reps - 3-sec hold Supine Bridge - 1 x daily - 7 x weekly - 3 sets - 10 reps - 3-sec hold  Seated Hamstring Stretch - 1 x daily - 7 x weekly - 2-3 reps - 30-sec hold   ASSESSMENT:   CLINICAL IMPRESSION: Pt responded well to all interventions today, demonstrating good form and no increase in pain. Following his Rt TKA scheduled for Monday, he will be seen for a re-evaluation due to his change in status. New goals will be established at that point. The pt will continue to benefit from skilled PT to address his primary impairments and return to his prior level of function with less limitation.     GOALS: Goals reviewed with patient? Yes   SHORT TERM GOALS:   STG Name Target Date Goal status  1 Pt will report understanding and adherence to his HEP in order to promote independence in the management of his primary impairments. Baseline: HEP provided at eval 02/19/2021: Pt reports daily adherence to his HEP 02/21/2021 ACHIEVED    LONG TERM GOALS:    LTG Name Target Date Goal status  1 Pt will report ability to walk 10 minutes with 0-5/10 pain in  order to grocery shop with less limitation. Baseline: Pt only able to walk about 100 meters before needing a rest due to pain. 02/19/2021: Pt reports ability to walk  30 minutes with 5/10 pain 03/21/2021 ACHIEVED  2 Pt will achieve Rt knee extension strength of 4+/5 or greater in order to promote increased post-op strength for rehabilitative purposes. Baseline: 3+/5 02/19/2021: 4/5 03/21/2021 Progressing  3 Pt will demonstrate 5 weight-bearing squat limited by 25% or less with 0-5/10 pain in order to return to doing yard work with less limitation. Baseline: 50% limited squat with 8/10 pain 02/19/2021: 3x10 full depth squat with 2/10 pain 03/21/2021 ACHIEVED  4 Pt will achieve Rt knee extension AROM of -5 degrees or better in order to promote WNL gait for community ambulation Baseline: -12 degrees 03/21/2021 INITIAL    PLAN: PT FREQUENCY: 2x/week   PT DURATION: 8 weeks   PLANNED INTERVENTIONS: Therapeutic exercises, Therapeutic activity, Neuro Muscular re-education, Balance training, Gait training, Patient/Family education, Joint mobilization, Stair training, Aquatic Therapy, Dry Needling, Cryotherapy, Moist heat, Taping, and Manual therapy    Vanessa [place], PT, DPT 02/26/21 1:41 PM

## 2021-02-26 ENCOUNTER — Other Ambulatory Visit: Payer: Self-pay

## 2021-02-26 ENCOUNTER — Ambulatory Visit: Payer: Medicaid Other

## 2021-02-26 DIAGNOSIS — M25561 Pain in right knee: Secondary | ICD-10-CM | POA: Diagnosis not present

## 2021-02-26 DIAGNOSIS — M25661 Stiffness of right knee, not elsewhere classified: Secondary | ICD-10-CM

## 2021-02-26 DIAGNOSIS — G8929 Other chronic pain: Secondary | ICD-10-CM

## 2021-02-26 DIAGNOSIS — M6281 Muscle weakness (generalized): Secondary | ICD-10-CM

## 2021-02-26 DIAGNOSIS — R2689 Other abnormalities of gait and mobility: Secondary | ICD-10-CM

## 2021-03-02 ENCOUNTER — Ambulatory Visit (HOSPITAL_BASED_OUTPATIENT_CLINIC_OR_DEPARTMENT_OTHER): Payer: Medicaid Other | Admitting: Certified Registered"

## 2021-03-02 ENCOUNTER — Encounter (HOSPITAL_COMMUNITY)
Admission: RE | Disposition: A | Discharge status: Home / Self Care | Payer: Self-pay | Source: Home / Self Care | Attending: Orthopedic Surgery

## 2021-03-02 ENCOUNTER — Ambulatory Visit (HOSPITAL_COMMUNITY): Payer: Medicaid Other

## 2021-03-02 ENCOUNTER — Ambulatory Visit (HOSPITAL_COMMUNITY): Payer: Medicaid Other | Admitting: Certified Registered"

## 2021-03-02 ENCOUNTER — Encounter (HOSPITAL_COMMUNITY): Payer: Self-pay | Admitting: Orthopedic Surgery

## 2021-03-02 ENCOUNTER — Ambulatory Visit (HOSPITAL_COMMUNITY)
Admission: RE | Admit: 2021-03-02 | Discharge: 2021-03-02 | Disposition: A | Discharge status: Home / Self Care | Payer: Medicaid Other | Attending: Orthopedic Surgery | Admitting: Orthopedic Surgery

## 2021-03-02 DIAGNOSIS — M1711 Unilateral primary osteoarthritis, right knee: Secondary | ICD-10-CM | POA: Diagnosis not present

## 2021-03-02 DIAGNOSIS — E669 Obesity, unspecified: Secondary | ICD-10-CM | POA: Insufficient documentation

## 2021-03-02 DIAGNOSIS — Z01818 Encounter for other preprocedural examination: Secondary | ICD-10-CM

## 2021-03-02 DIAGNOSIS — I10 Essential (primary) hypertension: Secondary | ICD-10-CM | POA: Diagnosis not present

## 2021-03-02 DIAGNOSIS — Z6830 Body mass index (BMI) 30.0-30.9, adult: Secondary | ICD-10-CM | POA: Insufficient documentation

## 2021-03-02 DIAGNOSIS — Z9889 Other specified postprocedural states: Secondary | ICD-10-CM

## 2021-03-02 DIAGNOSIS — I2510 Atherosclerotic heart disease of native coronary artery without angina pectoris: Secondary | ICD-10-CM

## 2021-03-02 HISTORY — PX: TOTAL KNEE ARTHROPLASTY: SHX125

## 2021-03-02 LAB — BASIC METABOLIC PANEL
Anion gap: 6 (ref 5–15)
BUN: 12 mg/dL (ref 6–20)
CO2: 27 mmol/L (ref 22–32)
Calcium: 8.3 mg/dL — ABNORMAL LOW (ref 8.9–10.3)
Chloride: 105 mmol/L (ref 98–111)
Creatinine, Ser: 0.85 mg/dL (ref 0.61–1.24)
GFR, Estimated: >60 mL/min (ref >60)
Glucose, Bld: 112 mg/dL — ABNORMAL HIGH (ref 70–99)
Potassium: 4.1 mmol/L (ref 3.5–5.1)
Sodium: 138 mmol/L (ref 135–145)

## 2021-03-02 LAB — CBC
HCT: 42.7 % (ref 39.0–52.0)
Hemoglobin: 13.8 g/dL (ref 13.0–17.0)
MCH: 28.3 pg (ref 26.0–34.0)
MCHC: 32.3 g/dL (ref 30.0–36.0)
MCV: 87.5 fL (ref 80.0–100.0)
Platelets: 327 10*3/uL (ref 150–400)
RBC: 4.88 MIL/uL (ref 4.22–5.81)
RDW: 13.2 % (ref 11.5–15.5)
WBC: 6.1 10*3/uL (ref 4.0–10.5)
nRBC: 0 % (ref 0.0–0.2)

## 2021-03-02 LAB — ABO/RH: ABO/RH(D): O POS

## 2021-03-02 SURGERY — ARTHROPLASTY, KNEE, TOTAL
Anesthesia: General | Site: Knee | Laterality: Right

## 2021-03-02 MED ORDER — TRANEXAMIC ACID-NACL 1000-0.7 MG/100ML-% IV SOLN
1000.0000 mg | INTRAVENOUS | Status: AC
  Administered 2021-03-02: 1000 mg via INTRAVENOUS
  Filled 2021-03-02: qty 100

## 2021-03-02 MED ORDER — TRANEXAMIC ACID 1000 MG/10ML IV SOLN
2000.0000 mg | INTRAVENOUS | Status: DC
  Filled 2021-03-02: qty 20

## 2021-03-02 MED ORDER — WATER FOR IRRIGATION, STERILE IR SOLN
Status: DC | PRN
Start: 2021-03-02 — End: 2021-03-02
  Administered 2021-03-02: 2000 mL

## 2021-03-02 MED ORDER — BUPIVACAINE LIPOSOME 1.3 % IJ SUSP: 20.0000 mL | Freq: Once | INTRAMUSCULAR | Status: DC

## 2021-03-02 MED ORDER — ONDANSETRON HCL 4 MG/2ML IJ SOLN: 4.0000 mg | Freq: Four times a day (QID) | INTRAMUSCULAR | Status: DC | PRN

## 2021-03-02 MED ORDER — ORAL CARE MOUTH RINSE: 15.0000 mL | Freq: Once | OROMUCOSAL | Status: AC

## 2021-03-02 MED ORDER — CEFAZOLIN SODIUM-DEXTROSE 2-4 GM/100ML-% IV SOLN
2.0000 g | INTRAVENOUS | Status: AC
  Administered 2021-03-02: 2 g via INTRAVENOUS
  Filled 2021-03-02: qty 100

## 2021-03-02 MED ORDER — POVIDONE-IODINE 10 % EX SWAB
2.0000 "application " | Freq: Once | CUTANEOUS | Status: AC
  Administered 2021-03-02: 2 via TOPICAL

## 2021-03-02 MED ORDER — PROPOFOL 1000 MG/100ML IV EMUL
INTRAVENOUS | Status: AC
  Filled 2021-03-02: qty 200

## 2021-03-02 MED ORDER — HYDROMORPHONE HCL 1 MG/ML IJ SOLN: 0.2500 mg | INTRAMUSCULAR | Status: DC | PRN

## 2021-03-02 MED ORDER — FENTANYL CITRATE (PF) 100 MCG/2ML IJ SOLN
INTRAMUSCULAR | Status: AC
  Filled 2021-03-02: qty 2

## 2021-03-02 MED ORDER — FENTANYL CITRATE (PF) 100 MCG/2ML IJ SOLN
INTRAMUSCULAR | Status: DC | PRN
  Administered 2021-03-02: 100 ug via INTRAVENOUS

## 2021-03-02 MED ORDER — DEXAMETHASONE SODIUM PHOSPHATE 10 MG/ML IJ SOLN
INTRAMUSCULAR | Status: AC
  Filled 2021-03-02: qty 1

## 2021-03-02 MED ORDER — LACTATED RINGERS IV BOLUS
250.0000 mL | Freq: Once | INTRAVENOUS | Status: AC
  Administered 2021-03-02: 250 mL via INTRAVENOUS

## 2021-03-02 MED ORDER — BUPIVACAINE HCL (PF) 0.5 % IJ SOLN
INTRAMUSCULAR | Status: DC | PRN
  Administered 2021-03-02: 20 mL via PERINEURAL

## 2021-03-02 MED ORDER — BUPIVACAINE LIPOSOME 1.3 % IJ SUSP
INTRAMUSCULAR | Status: DC | PRN
Start: 2021-03-02 — End: 2021-03-02
  Administered 2021-03-02: 20 mL

## 2021-03-02 MED ORDER — OXYCODONE HCL 5 MG PO TABS: 5.0000 mg | ORAL_TABLET | ORAL | 0 refills | Status: AC | PRN

## 2021-03-02 MED ORDER — ONDANSETRON HCL 4 MG/2ML IJ SOLN
INTRAMUSCULAR | Status: AC
  Filled 2021-03-02: qty 2

## 2021-03-02 MED ORDER — LACTATED RINGERS IV SOLN: INTRAVENOUS | Status: DC

## 2021-03-02 MED ORDER — METHOCARBAMOL 500 MG PO TABS
500.0000 mg | ORAL_TABLET | Freq: Three times a day (TID) | ORAL | 0 refills | Status: AC | PRN
Start: 2021-03-02 — End: 2021-03-12

## 2021-03-02 MED ORDER — PROPOFOL 500 MG/50ML IV EMUL
INTRAVENOUS | Status: DC | PRN
Start: 2021-03-02 — End: 2021-03-02
  Administered 2021-03-02: 100 ug/kg/min via INTRAVENOUS

## 2021-03-02 MED ORDER — OXYCODONE HCL 5 MG PO TABS
5.0000 mg | ORAL_TABLET | Freq: Once | ORAL | Status: AC | PRN
  Administered 2021-03-02: 5 mg via ORAL

## 2021-03-02 MED ORDER — 0.9 % SODIUM CHLORIDE (POUR BTL) OPTIME
TOPICAL | Status: DC | PRN
Start: 2021-03-02 — End: 2021-03-02
  Administered 2021-03-02: 1000 mL

## 2021-03-02 MED ORDER — SODIUM CHLORIDE 0.9 % IR SOLN
Status: DC | PRN
  Administered 2021-03-02: 1000 mL
  Administered 2021-03-02: 500 mL
  Administered 2021-03-02: 1000 mL

## 2021-03-02 MED ORDER — OXYCODONE HCL 5 MG/5ML PO SOLN: 5.0000 mg | Freq: Once | ORAL | Status: AC | PRN

## 2021-03-02 MED ORDER — PHENYLEPHRINE 40 MCG/ML (10ML) SYRINGE FOR IV PUSH (FOR BLOOD PRESSURE SUPPORT)
PREFILLED_SYRINGE | INTRAVENOUS | Status: AC
  Filled 2021-03-02: qty 10

## 2021-03-02 MED ORDER — CELECOXIB 200 MG PO CAPS
400.0000 mg | ORAL_CAPSULE | Freq: Once | ORAL | Status: AC
  Administered 2021-03-02: 400 mg via ORAL
  Filled 2021-03-02: qty 2

## 2021-03-02 MED ORDER — ACETAMINOPHEN 500 MG PO TABS
1000.0000 mg | ORAL_TABLET | Freq: Once | ORAL | Status: AC
  Administered 2021-03-02: 1000 mg via ORAL
  Filled 2021-03-02: qty 2

## 2021-03-02 MED ORDER — OXYCODONE HCL 5 MG PO TABS: 5.0000 mg | ORAL_TABLET | ORAL | Status: DC | PRN

## 2021-03-02 MED ORDER — CHLORHEXIDINE GLUCONATE 0.12 % MT SOLN
15.0000 mL | Freq: Once | OROMUCOSAL | Status: AC
  Administered 2021-03-02: 15 mL via OROMUCOSAL

## 2021-03-02 MED ORDER — OXYCODONE HCL 5 MG PO TABS
ORAL_TABLET | ORAL | Status: AC
  Filled 2021-03-02: qty 1

## 2021-03-02 MED ORDER — ISOPROPYL ALCOHOL 70 % SOLN
Status: AC
  Filled 2021-03-02: qty 480

## 2021-03-02 MED ORDER — ACETAMINOPHEN 500 MG PO TABS: 1000.0000 mg | ORAL_TABLET | Freq: Three times a day (TID) | ORAL | 0 refills | Status: AC | PRN

## 2021-03-02 MED ORDER — PHENYLEPHRINE 40 MCG/ML (10ML) SYRINGE FOR IV PUSH (FOR BLOOD PRESSURE SUPPORT)
PREFILLED_SYRINGE | INTRAVENOUS | Status: DC | PRN
  Administered 2021-03-02: 80 ug via INTRAVENOUS

## 2021-03-02 MED ORDER — KETOROLAC TROMETHAMINE 15 MG/ML IJ SOLN
15.0000 mg | Freq: Four times a day (QID) | INTRAMUSCULAR | Status: DC
  Administered 2021-03-02: 15 mg via INTRAVENOUS

## 2021-03-02 MED ORDER — HYDROMORPHONE HCL 1 MG/ML IJ SOLN
INTRAMUSCULAR | Status: AC
  Administered 2021-03-02: 1 mg via INTRAVENOUS
  Filled 2021-03-02: qty 1

## 2021-03-02 MED ORDER — ASPIRIN EC 81 MG PO TBEC: 81.0000 mg | DELAYED_RELEASE_TABLET | Freq: Two times a day (BID) | ORAL | 0 refills | Status: AC

## 2021-03-02 MED ORDER — MIDAZOLAM HCL 2 MG/2ML IJ SOLN
INTRAMUSCULAR | Status: AC
  Filled 2021-03-02: qty 2

## 2021-03-02 MED ORDER — DEXAMETHASONE SODIUM PHOSPHATE 10 MG/ML IJ SOLN: 8.0000 mg | Freq: Once | INTRAMUSCULAR | Status: DC

## 2021-03-02 MED ORDER — ONDANSETRON HCL 4 MG/2ML IJ SOLN
INTRAMUSCULAR | Status: DC | PRN
Start: 2021-03-02 — End: 2021-03-02
  Administered 2021-03-02: 4 mg via INTRAVENOUS

## 2021-03-02 MED ORDER — MIDAZOLAM HCL 2 MG/2ML IJ SOLN
INTRAMUSCULAR | Status: DC | PRN
  Administered 2021-03-02: 2 mg via INTRAVENOUS

## 2021-03-02 MED ORDER — CEFAZOLIN SODIUM-DEXTROSE 2-4 GM/100ML-% IV SOLN: 2.0000 g | Freq: Four times a day (QID) | INTRAVENOUS | Status: DC

## 2021-03-02 MED ORDER — ISOPROPYL ALCOHOL 70 % SOLN
Status: DC | PRN
  Administered 2021-03-02: 1 via TOPICAL

## 2021-03-02 MED ORDER — PROPOFOL 10 MG/ML IV BOLUS
INTRAVENOUS | Status: AC
  Filled 2021-03-02: qty 20

## 2021-03-02 MED ORDER — SODIUM CHLORIDE (PF) 0.9 % IJ SOLN
INTRAMUSCULAR | Status: AC
  Filled 2021-03-02: qty 10

## 2021-03-02 MED ORDER — DEXAMETHASONE SODIUM PHOSPHATE 10 MG/ML IJ SOLN
INTRAMUSCULAR | Status: DC | PRN
  Administered 2021-03-02: 8 mg via INTRAVENOUS

## 2021-03-02 MED ORDER — SODIUM CHLORIDE 0.9% FLUSH
INTRAVENOUS | Status: DC | PRN
  Administered 2021-03-02: 60 mL

## 2021-03-02 MED ORDER — LACTATED RINGERS IV BOLUS
500.0000 mL | Freq: Once | INTRAVENOUS | Status: AC
  Administered 2021-03-02: 500 mL via INTRAVENOUS

## 2021-03-02 MED ORDER — BUPIVACAINE LIPOSOME 1.3 % IJ SUSP
INTRAMUSCULAR | Status: AC
  Filled 2021-03-02: qty 20

## 2021-03-02 MED ORDER — ALBUTEROL SULFATE HFA 108 (90 BASE) MCG/ACT IN AERS
INHALATION_SPRAY | RESPIRATORY_TRACT | Status: AC
  Filled 2021-03-02: qty 6.7

## 2021-03-02 MED ORDER — ONDANSETRON HCL 4 MG PO TABS
4.0000 mg | ORAL_TABLET | Freq: Four times a day (QID) | ORAL | Status: DC | PRN
  Filled 2021-03-02: qty 1

## 2021-03-02 MED ORDER — ONDANSETRON HCL 4 MG/2ML IJ SOLN: 4.0000 mg | Freq: Once | INTRAMUSCULAR | Status: DC | PRN

## 2021-03-02 MED ORDER — HYDROMORPHONE HCL 1 MG/ML IJ SOLN: 0.5000 mg | INTRAMUSCULAR | Status: DC | PRN

## 2021-03-02 MED ORDER — KETOROLAC TROMETHAMINE 15 MG/ML IJ SOLN
INTRAMUSCULAR | Status: AC
  Filled 2021-03-02: qty 1

## 2021-03-02 MED ORDER — ACETAMINOPHEN 325 MG PO TABS: 325.0000 mg | ORAL_TABLET | Freq: Four times a day (QID) | ORAL | Status: DC | PRN

## 2021-03-02 MED ORDER — ONDANSETRON HCL 4 MG PO TABS: 4.0000 mg | ORAL_TABLET | Freq: Three times a day (TID) | ORAL | 0 refills | Status: AC | PRN

## 2021-03-02 MED ORDER — BUPIVACAINE IN DEXTROSE 0.75-8.25 % IT SOLN
INTRATHECAL | Status: DC | PRN
  Administered 2021-03-02: 1.6 mL via INTRATHECAL

## 2021-03-02 SURGICAL SUPPLY — 67 items
ADH SKN CLS APL DERMABOND .7 (GAUZE/BANDAGES/DRESSINGS) ×1
APL PRP STRL LF DISP 70% ISPRP (MISCELLANEOUS) ×2
BAG COUNTER SPONGE SURGICOUNT (BAG) ×1 IMPLANT
BAG DECANTER FOR FLEXI CONT (MISCELLANEOUS) ×1 IMPLANT
BAG SPNG CNTER NS LX DISP (BAG) ×1
BLADE HEX COATED 2.75 (ELECTRODE) ×2 IMPLANT
BLADE SAG 18X100X1.27 (BLADE) ×2 IMPLANT
BLADE SAW SAG 35X64 .89 (BLADE) ×2 IMPLANT
BNDG CMPR MED 10X6 ELC LF (GAUZE/BANDAGES/DRESSINGS) ×1
BNDG COHESIVE 4X5 TAN ST LF (GAUZE/BANDAGES/DRESSINGS) ×1 IMPLANT
BNDG ELASTIC 6X10 VLCR STRL LF (GAUZE/BANDAGES/DRESSINGS) ×2 IMPLANT
BNDG ELASTIC 6X5.8 VLCR STR LF (GAUZE/BANDAGES/DRESSINGS) ×1 IMPLANT
BOWL SMART MIX CTS (DISPOSABLE) ×1 IMPLANT
BSPLAT TIB 5D E CMNT KN RT (Knees) ×1 IMPLANT
CEMENT BONE R 1X40 (Cement) ×2 IMPLANT
CHLORAPREP W/TINT 26 (MISCELLANEOUS) ×4 IMPLANT
COMP FEM KNEE NRW SZ9 RT (Joint) ×2 IMPLANT
COMPONENT FEM KNEE NRW SZ9 RT (Joint) IMPLANT
COVER SURGICAL LIGHT HANDLE (MISCELLANEOUS) ×2 IMPLANT
CUFF TOURN SGL QUICK 34 (TOURNIQUET CUFF) ×2
CUFF TRNQT CYL 34X4.125X (TOURNIQUET CUFF) ×1 IMPLANT
DERMABOND ADVANCED (GAUZE/BANDAGES/DRESSINGS) ×1
DERMABOND ADVANCED .7 DNX12 (GAUZE/BANDAGES/DRESSINGS) ×1 IMPLANT
DRAPE INCISE IOBAN 66X45 STRL (DRAPES) ×1 IMPLANT
DRAPE INCISE IOBAN 85X60 (DRAPES) ×2 IMPLANT
DRAPE SHEET LG 3/4 BI-LAMINATE (DRAPES) ×2 IMPLANT
DRAPE U-SHAPE 47X51 STRL (DRAPES) ×2 IMPLANT
DRESSING AQUACEL AG SP 3.5X10 (GAUZE/BANDAGES/DRESSINGS) ×1 IMPLANT
DRSG AQUACEL AG ADV 3.5X10 (GAUZE/BANDAGES/DRESSINGS) ×1 IMPLANT
DRSG AQUACEL AG SP 3.5X10 (GAUZE/BANDAGES/DRESSINGS) ×2
GLOVE SRG 8 PF TXTR STRL LF DI (GLOVE) ×1 IMPLANT
GLOVE SURG ENC MOIS LTX SZ8 (GLOVE) ×4 IMPLANT
GLOVE SURG UNDER POLY LF SZ8 (GLOVE) ×2
GOWN STRL REUS W/TWL XL LVL3 (GOWN DISPOSABLE) ×2 IMPLANT
HANDPIECE INTERPULSE COAX TIP (DISPOSABLE) ×2
HDLS TROCR DRIL PIN KNEE 75 (PIN) ×4
HOLDER FOLEY CATH W/STRAP (MISCELLANEOUS) ×1 IMPLANT
HOOD PEEL AWAY FLYTE STAYCOOL (MISCELLANEOUS) ×6 IMPLANT
INSERT TIB ARTI SZ8-11 RT 11 (Joint) ×1 IMPLANT
MANIFOLD NEPTUNE II (INSTRUMENTS) ×2 IMPLANT
MARKER SKIN DUAL TIP RULER LAB (MISCELLANEOUS) ×4 IMPLANT
NS IRRIG 1000ML POUR BTL (IV SOLUTION) ×2 IMPLANT
PACK TOTAL KNEE CUSTOM (KITS) ×2 IMPLANT
PIN DRILL HDLS TROCAR 75 4PK (PIN) IMPLANT
PROTECTOR NERVE ULNAR (MISCELLANEOUS) ×2 IMPLANT
SCREW HEADED 33MM KNEE (MISCELLANEOUS) ×2 IMPLANT
SET HNDPC FAN SPRY TIP SCT (DISPOSABLE) ×1 IMPLANT
SOLUTION IRRIG SURGIPHOR (IV SOLUTION) ×1 IMPLANT
SPIKE FLUID TRANSFER (MISCELLANEOUS) ×1 IMPLANT
SPONGE T-LAP 18X18 ~~LOC~~+RFID (SPONGE) ×6 IMPLANT
STEM POLY PAT PLY 32M KNEE (Knees) ×1 IMPLANT
STEM TIBIA 5 DEG SZ E R KNEE (Knees) IMPLANT
STRIP CLOSURE SKIN 1/2X4 (GAUZE/BANDAGES/DRESSINGS) ×1 IMPLANT
SUT MNCRL AB 3-0 PS2 18 (SUTURE) ×2 IMPLANT
SUT STRATAFIX 0 PDS 27 VIOLET (SUTURE) ×2
SUT STRATAFIX PDO 1 14 VIOLET (SUTURE) ×2
SUT STRATFX PDO 1 14 VIOLET (SUTURE) ×1
SUT VIC AB 2-0 CT2 27 (SUTURE) ×4 IMPLANT
SUTURE STRATFX 0 PDS 27 VIOLET (SUTURE) ×1 IMPLANT
SUTURE STRATFX PDO 1 14 VIOLET (SUTURE) ×1 IMPLANT
SYR 10ML LL (SYRINGE) ×1 IMPLANT
SYR 50ML LL SCALE MARK (SYRINGE) ×2 IMPLANT
TIBIA STEM 5 DEG SZ E R KNEE (Knees) ×2 IMPLANT
TRAY FOLEY MTR SLVR 14FR STAT (SET/KITS/TRAYS/PACK) IMPLANT
TRAY FOLEY MTR SLVR 16FR STAT (SET/KITS/TRAYS/PACK) ×1 IMPLANT
TUBE SUCTION HIGH CAP CLEAR NV (SUCTIONS) ×2 IMPLANT
WRAP KNEE MAXI GEL POST OP (GAUZE/BANDAGES/DRESSINGS) ×1 IMPLANT

## 2021-03-02 NOTE — Anesthesia Procedure Notes (Signed)
Anesthesia Procedure Image    

## 2021-03-02 NOTE — Evaluation (Signed)
Physical Therapy Evaluation Patient Details Name: Matthew Stark MRN: [id number] DOB: June 14, 1960 Today's Date: 03/02/2021  History of Present Illness  61 yo male s/p R TKA. PMH: none  Clinical Impression  Patient evaluated by Physical Therapy with no further acute PT needs identified. All education has been completed and the patient has no further questions.  Pt reports independence at baseline.  Reviewed areas below, safety with mobility. Pt states he will have family assist as needed.  See below for any follow-up Physical Therapy or equipment needs. PT is signing off. Thank you for this referral.        Recommendations for follow up therapy are one component of a multi-disciplinary discharge planning process, led by the attending physician.  Recommendations may be updated based on patient status, additional functional criteria and insurance authorization.  Follow Up Recommendations Follow physician's recommendations for discharge plan and follow up therapies    Assistance Recommended at Discharge    Patient can return home with the following  A little help with walking and/or transfers;Assistance with cooking/housework;Assist for transportation;A little help with bathing/dressing/bathroom;Help with stairs or ramp for entrance    Equipment Recommendations Rolling walker (2 wheels)  Recommendations for Other Services       Functional Status Assessment Patient has had a recent decline in their functional status and demonstrates the ability to make significant improvements in function in a reasonable and predictable amount of time.     Precautions / Restrictions Precautions Precautions: Fall;Knee Restrictions Weight Bearing Restrictions: No Other Position/Activity Restrictions: WBAT      Mobility  Bed Mobility Overal bed mobility: Needs Assistance Bed Mobility: Supine to Sit     Supine to sit: Supervision          Transfers Overall transfer level: Needs  assistance Equipment used: Rolling walker (2 wheels) Transfers: Sit to/from Stand Sit to Stand: Supervision           General transfer comment: cues for hand placement    Ambulation/Gait Ambulation/Gait assistance: Supervision Gait Distance (Feet): 100 Feet Assistive device: Rolling walker (2 wheels) Gait Pattern/deviations: Step-to pattern, Decreased stance time - right       General Gait Details: cues for sequence, RW position  Stairs            Wheelchair Mobility    Modified Rankin (Stroke Patients Only)       Balance                                             Pertinent Vitals/Pain Pain Assessment Pain Assessment: Faces Faces Pain Scale: Hurts little more Pain Location: right knee Pain Descriptors / Indicators: Grimacing, Sore Pain Intervention(s): Premedicated before session, Monitored during session, Limited activity within patient's tolerance    Home Living Family/patient expects to be discharged to:: Private residence Living Arrangements: Non-relatives/Friends   Type of Home: Apartment Home Access: Level entry       Home Layout: One level Home Equipment: None Additional Comments: reports he will be staying on ground floor apt    Prior Function Prior Level of Function : Independent/Modified Independent                     Hand Dominance        Extremity/Trunk Assessment   Upper Extremity Assessment Upper Extremity Assessment: Overall WFL for tasks assessed    Lower Extremity  Assessment Lower Extremity Assessment: RLE deficits/detail RLE Deficits / Details: ankle WFL,  15 degree quad lag, hip flexion 3/5       Communication   Communication: No difficulties  Cognition Arousal/Alertness: Awake/alert Behavior During Therapy: WFL for tasks assessed/performed Overall Cognitive Status: Within Functional Limits for tasks assessed                                          General  Comments      Exercises Total Joint Exercises Ankle Circles/Pumps: AROM, Both, 10 reps Quad Sets: AROM, Both, 10 reps Heel Slides: AROM, AAROM, 5 reps, Right Straight Leg Raises: AAROM, 5 reps, Right   Assessment/Plan    PT Assessment All further PT needs can be met in the next venue of care  PT Problem List         PT Treatment Interventions      PT Goals (Current goals can be found in the Care Plan section)  Acute Rehab PT Goals Patient Stated Goal: home PT Goal Formulation: All assessment and education complete, DC therapy    Frequency       Co-evaluation               AM-PAC PT "6 Clicks" Mobility  Outcome Measure Help needed turning from your back to your side while in a flat bed without using bedrails?: None Help needed moving from lying on your back to sitting on the side of a flat bed without using bedrails?: None Help needed moving to and from a bed to a chair (including a wheelchair)?: A Little Help needed standing up from a chair using your arms (e.g., wheelchair or bedside chair)?: A Little Help needed to walk in hospital room?: A Little Help needed climbing 3-5 steps with a railing? : A Little 6 Click Score: 20    End of Session Equipment Utilized During Treatment: Gait belt Activity Tolerance: Patient tolerated treatment well Patient left: with call bell/phone within reach;in chair Nurse Communication: Mobility status PT Visit Diagnosis: Difficulty in walking, not elsewhere classified (R26.2)    Time: 1322-1350 PT Time Calculation (min) (ACUTE ONLY): 28 min   Charges:   PT Evaluation $PT Eval Low Complexity: 1 Low PT Treatments $Gait Training: 8-22 mins        Baxter Flattery, PT  Acute Rehab Dept ([place]) [phone] Pager [phone]  03/02/2021   [facility] 03/02/2021, 2:04 PM

## 2021-03-02 NOTE — Op Note (Addendum)
DATE OF SURGERY:  03/02/2021 TIME: 9:36 AM  PATIENT NAME:  Matthew Stark   AGE: 61 y.o.    PRE-OPERATIVE DIAGNOSIS: End-stage right knee osteoarthritis  POST-OPERATIVE DIAGNOSIS:  Same  PROCEDURE: Right total Knee Arthroplasty  SURGEON:  Jakeb Lamping A Braylin Xu, MD   ASSISTANT: Izola Price, RNFA, present and scrubbed throughout the case, critical for assistance with exposure, retraction, instrumentation, and closure.   OPERATIVE IMPLANTS:  Zimmer Person[employer] cemented femur 9 narrow, E tibia, 7mm patella button, 47mm MC poly  Implant Name Type Inv. Item Serial No. Manufacturer Lot No. LRB No. Used Action  CEMENT BONE R 1X40 - [id number] Cement CEMENT BONE R 1X40  ZIMMER RECON(ORTH,TRAU,BIO,SG) [id number] Right 2 Implanted  TIBIA STEM 5 DEG SZ E R KNEE - [id number] Knees TIBIA STEM 5 DEG SZ E R KNEE  ZIMMER [employer]ON(ORTH,TRAU,BIO,SG) [id number] Right 1 Implanted  COMP FEM KNEE NRW SZ9 RT - [id number] Joint COMP FEM KNEE NRW SZ9 RT  ZIMMER RECON(ORTH,TRAU,BIO,SG) [id number] Right 1 Implanted  STEM POLY PAT PLY 54M KNEE - [id number] Knees STEM POLY PAT PLY 54M KNEE  ZIMMER RECON(ORTH,TRAU,BIO,SG) [id number] Right 1 Implanted  INSERT TIB ARTI SZ8-11 RT 11 - [id number] Joint INSERT TIB ARTI SZ8-11 RT 11  ZIMMER RECON(ORTH,TRAU,BIO,SG) [id number] Right 1 Implanted      PREOPERATIVE INDICATIONS:  Matthew Stark is a 61 y.o. year old male with end stage bone on bone degenerative arthritis of the knee who failed conservative treatment, including injections, antiinflammatories, activity modification, and assistive devices, and had significant impairment of their activities of daily living, and elected for Total Knee Arthroplasty.   The risks, benefits, and alternatives were discussed at length including but not limited to the risks of infection, bleeding, nerve injury, stiffness, blood clots, the need for revision surgery, cardiopulmonary complications, among others, and they were willing to proceed.  ESTIMATED  BLOOD LOSS: 25cc  OPERATIVE DESCRIPTION:   Once adequate anesthesia, preoperative antibiotics, 2 gm of ancef,1 gm of Tranexamic Acid, and 8 mg of Decadron administered, the patient was positioned supine with a right thigh tourniquet placed.  The right lower extremity was prepped and draped in sterile fashion.  A time-  out was performed identifying the patient, planned procedure, and the appropriate extremity.     The leg was  exsanguinated, tourniquet elevated to 250 mmHg.  A midline incision was   made followed by median parapatellar arthrotomy. Anterior horn of the medial meniscus was released and resected. A medial release was performed, the infrapatellar fat pad was resected with care taken to protect the patellar tendon. The suprapatellar fat was removed to exposed the distal anterior femur. The anterior horn of the lateral meniscus and ACL were released.    Following initial  exposure, attention was first to the femur.  The femoral   canal was opened with a drill, irrigated to try to prevent fat emboli.  An   intramedullary rod was passed set at 5 degrees valgus, 11 mm. The distal femur was resected.  Following this resection, the tibia was   subluxated anteriorly.  Using the extramedullary guide, 10 mm of bone was resected off   the proximal lateral tibia.  This was still a relatively tight and had a residual flexion contracture so we resected additional 2 mm off the proximal tibia.  We confirmed the gap would be   stable medially and laterally with a size 28mm spacer block as well as confirmed that the tibial cut was perpendicular in the coronal plane, checking with  an alignment rod.    Once this was done, the posterior femoral referencing femoral sizer was placed under to the posterior condyles with 3 degrees of external rotational. The femur was sized to be a size 9 in the anterior-  posterior dimension. The   anterior, posterior, and  chamfer cuts were made without difficulty nor    notching making certain that I was along the anterior cortex to help   with flexion gap stability.  Next a laminar spreader was placed with the knee in flexion and the medial lateral menisci were resected.  5 cc of the Exparel mixture was injected in the medial side of the back of the knee and 3 cc in the lateral side.  1/2 inch curved osteotome was used to resect posterior osteophyte that was then removed with a pituitary rongeur.       At this point, the tibia was sized to be a size E.  The size E tray was   then pinned in position. Trial reduction was now carried with a 9 femur,  E tibia, a 11 mm MC insert.  The knee had full extension and was stable to varus valgus stress in extension.  The knee was slightly tight in flexion and the PCL was partially released.   Attention was next directed to the patella.  Precut  measurement was noted to be 24 mm.  I resected down to 15 mm and used a  9mm patellar button to restore patellar height as well as cover the cut surface.     The patella lug holes were drilled and a 32 mm patella poly trial was placed.    The knee was brought to full extension with good flexion stability with the patella   tracking through the trochlea without application of pressure.     Next the femoral component was again assessed and determined to be seated and appropriately lateralized.  The femoral lug holes were drilled.  The femoral component was then removed.Tibial component was again assessed and felt to be seated and appropriately rotated with the medial third of the tubercle. The tibia was then drilled, and keel punched.     Final components were  opened and regular cement was mixed.      Final implants were then  cemented onto cleaned and dried cut surfaces of bone with the knee brought to extension with a 11 mm MC poly.  The knee was irrigated with sterile Betadine diluted in saline as well as pulse lavage normal saline. The synovial lining was  then injected a  dilute Exparel.      Once the cement had fully cured, excess cement was removed   throughout the knee.  I confirmed that I was satisfied with the range of   motion and stability, and the final 11 mm MC poly insert was chosen.  It was   placed into the knee.         The tourniquet had been let down at 90 minutes.  No significant   hemostasis was required.  The medial parapatellar arthrotomy was then reapproximated using #1 Stratafix sutures with the knee  in flexion.  The   remaining wound was closed with 0 stratafix, 2-0 Vicryl, and running 3-0 Monocryl.   The knee was cleaned, dried, dressed sterilely using Dermabond and   Aquacel dressing.  The patient was then  brought to recovery room in stable condition, tolerating the procedure  well. There were no complications.   Post op  recs: WB: WBAT Abx: ancef x23 hours post op Imaging: PACU xrays DVT prophylaxis: Aspirin 81mg  BID x4 weeks Follow up: 2 weeks after surgery for a wound check with Dr. Zachery Dakins at [facility].  Address: [address], [place], [place] [address]  Office Phone: [phone]  Charlies Constable, MD Orthopaedic Surgery

## 2021-03-02 NOTE — Transfer of Care (Signed)
Immediate Anesthesia Transfer of Care Note  Patient: DEVERE BREM  Procedure(s) Performed: TOTAL KNEE ARTHROPLASTY (Right: Knee)  Patient Location: PACU  Anesthesia Type:Spinal and MAC combined with regional for post-op pain  Level of Consciousness: drowsy and patient cooperative  Airway & Oxygen Therapy: Patient Spontanous Breathing and Patient connected to face mask oxygen  Post-op Assessment: Report given to RN and Post -op Vital signs reviewed and stable  Post vital signs: Reviewed and stable  Last Vitals:  Vitals Value Taken Time  BP    Temp    Pulse 80 03/02/21 1005  Resp    SpO2 100 % 03/02/21 1005  Vitals shown include unvalidated device data.  Last Pain:  Vitals:   03/02/21 0602  TempSrc:   PainSc: 0-No pain      Patients Stated Pain Goal: 4 (87/19/59 7471)  Complications: No notable events documented.

## 2021-03-02 NOTE — Anesthesia Postprocedure Evaluation (Signed)
Anesthesia Post Note  Patient: Matthew Stark  Procedure(s) Performed: TOTAL KNEE ARTHROPLASTY (Right: Knee)     Patient location during evaluation: PACU Anesthesia Type: Spinal Level of consciousness: oriented and awake and alert Pain management: pain level controlled Vital Signs Assessment: post-procedure vital signs reviewed and stable Respiratory status: spontaneous breathing, respiratory function stable and patient connected to nasal cannula oxygen Cardiovascular status: blood pressure returned to baseline and stable Postop Assessment: no headache, no backache and no apparent nausea or vomiting Anesthetic complications: no   No notable events documented.  Last Vitals:  Vitals:   03/02/21 1045 03/02/21 1100  BP: 125/85 (!) 157/101  Pulse: 80 79  Resp: 11   Temp: 36.4 C   SpO2: 99% 98%    Last Pain:  Vitals:   03/02/21 1130  TempSrc:   PainSc: 4                  Mar Zettler S

## 2021-03-02 NOTE — Progress Notes (Signed)
Orthopedic Tech Progress Note Patient Details:  Matthew Stark [birth year] [id number]  Ortho Devices Type of Ortho Device: Bone foam zero knee Ortho Device/Splint Interventions: Application   Post Interventions Patient Tolerated: Well Instructions Provided: Care of device  [place] Pink 03/02/2021, 10:06 AM

## 2021-03-02 NOTE — Anesthesia Preprocedure Evaluation (Deleted)
Anesthesia Evaluation  Patient identified by MRN, date of birth, ID band Patient awake    Reviewed: Allergy & Precautions, NPO status , Patient's Chart, lab work & pertinent test results  Airway Mallampati: II  TM Distance: <3 FB Neck ROM: Full    Dental no notable dental hx.    Pulmonary neg pulmonary ROS,    Pulmonary exam normal breath sounds clear to auscultation       Cardiovascular hypertension, negative cardio ROS Normal cardiovascular exam Rhythm:Regular Rate:Normal     Neuro/Psych negative neurological ROS  negative psych ROS   GI/Hepatic negative GI ROS, Neg liver ROS,   Endo/Other  negative endocrine ROS  Renal/GU negative Renal ROS  negative genitourinary   Musculoskeletal negative musculoskeletal ROS (+)   Abdominal   Peds negative pediatric ROS (+)  Hematology negative hematology ROS (+)   Anesthesia Other Findings   Reproductive/Obstetrics negative OB ROS                             Anesthesia Physical Anesthesia Plan  ASA: 2  Anesthesia Plan: General   Post-op Pain Management:    Induction: Intravenous  PONV Risk Score and Plan: 2 and Ondansetron, Dexamethasone and Treatment may vary due to age or medical condition  Airway Management Planned: LMA  Additional Equipment:   Intra-op Plan:   Post-operative Plan: Extubation in OR  Informed Consent: I have reviewed the patients History and Physical, chart, labs and discussed the procedure including the risks, benefits and alternatives for the proposed anesthesia with the patient or authorized representative who has indicated his/her understanding and acceptance.     Dental advisory given  Plan Discussed with: CRNA and Surgeon  Anesthesia Plan Comments:         Anesthesia Quick Evaluation

## 2021-03-02 NOTE — Interval H&P Note (Signed)
The patient has been re-examined, and the chart reviewed, and there have been no interval changes to the documented history and physical.    The operative side was examined and the patient was confirmed to have. Sens DPN, SPN, TN intact, Motor EHL, ext, flex 5/5, and DP 2+, PT 2+, No significant edema.   The risks, benefits, and alternatives have been discussed at length with patient, and the patient is willing to proceed.  Right knee marked. Consent has been signed.

## 2021-03-02 NOTE — Discharge Instructions (Signed)
INSTRUCTIONS AFTER JOINT REPLACEMENT  ° °Remove items at home which could result in a fall. This includes throw rugs or furniture in walking pathways °ICE to the affected joint every three hours while awake for 30 minutes at a time, for at least the first 3-5 days, and then as needed for pain and swelling.  Continue to use ice for pain and swelling. You may notice swelling that will progress down to the foot and ankle.  This is normal after surgery.  Elevate your leg when you are not up walking on it.   °Continue to use the breathing machine you got in the hospital (incentive spirometer) which will help keep your temperature down.  It is common for your temperature to cycle up and down following surgery, especially at night when you are not up moving around and exerting yourself.  The breathing machine keeps your lungs expanded and your temperature down. ° ° °DIET:  As you were doing prior to hospitalization, we recommend a well-balanced diet. ° °DRESSING / WOUND CARE / SHOWERING ° °Keep the surgical dressing until follow up.  The dressing is water proof, so you can shower without any extra covering.  IF THE DRESSING FALLS OFF or the wound gets wet inside, change the dressing with sterile gauze.  Please use good hand washing techniques before changing the dressing.  Do not use any lotions or creams on the incision until instructed by your surgeon.   ° °ACTIVITY ° °Increase activity slowly as tolerated, but follow the weight bearing instructions below.   °No driving for 6 weeks or until further direction given by your physician.  You cannot drive while taking narcotics.  °No lifting or carrying greater than 10 lbs. until further directed by your surgeon. °Avoid periods of inactivity such as sitting longer than an hour when not asleep. This helps prevent blood clots.  °You may return to work once you are authorized by your doctor.  ° ° ° °WEIGHT BEARING  ° °Weight bearing as tolerated with assist device (walker, cane,  etc) as directed, use it as long as suggested by your surgeon or therapist, typically at least 4-6 weeks. ° ° °EXERCISES ° °Results after joint replacement surgery are often greatly improved when you follow the exercise, range of motion and muscle strengthening exercises prescribed by your doctor. Safety measures are also important to protect the joint from further injury. Any time any of these exercises cause you to have increased pain or swelling, decrease what you are doing until you are comfortable again and then slowly increase them. If you have problems or questions, call your caregiver or physical therapist for advice.  ° °Rehabilitation is important following a joint replacement. After just a few days of immobilization, the muscles of the leg can become weakened and shrink (atrophy).  These exercises are designed to build up the tone and strength of the thigh and leg muscles and to improve motion. Often times heat used for twenty to thirty minutes before working out will loosen up your tissues and help with improving the range of motion but do not use heat for the first two weeks following surgery (sometimes heat can increase post-operative swelling).  ° °These exercises can be done on a training (exercise) mat, on the floor, on a table or on a bed. Use whatever works the best and is most comfortable for you.    Use music or television while you are exercising so that the exercises are a pleasant break in your   day. This will make your life better with the exercises acting as a break in your routine that you can look forward to.   Perform all exercises about fifteen times, three times per day or as directed.  You should exercise both the operative leg and the other leg as well.  Exercises include:   Quad Sets - Tighten up the muscle on the front of the thigh (Quad) and hold for 5-10 seconds.   Straight Leg Raises - With your knee straight (if you were given a brace, keep it on), lift the leg to 60  degrees, hold for 3 seconds, and slowly lower the leg.  Perform this exercise against resistance later as your leg gets stronger.  Leg Slides: Lying on your back, slowly slide your foot toward your buttocks, bending your knee up off the floor (only go as far as is comfortable). Then slowly slide your foot back down until your leg is flat on the floor again.  Angel Wings: Lying on your back spread your legs to the side as far apart as you can without causing discomfort.  Hamstring Strength:  Lying on your back, push your heel against the floor with your leg straight by tightening up the muscles of your buttocks.  Repeat, but this time bend your knee to a comfortable angle, and push your heel against the floor.  You may put a pillow under the heel to make it more comfortable if necessary.   A rehabilitation program following joint replacement surgery can speed recovery and prevent re-injury in the future due to weakened muscles. Contact your doctor or a physical therapist for more information on knee rehabilitation.    CONSTIPATION  Constipation is defined medically as fewer than three stools per week and severe constipation as less than one stool per week.  Even if you have a regular bowel pattern at home, your normal regimen is likely to be disrupted due to multiple reasons following surgery.  Combination of anesthesia, postoperative narcotics, change in appetite and fluid intake all can affect your bowels.   YOU MUST use at least one of the following options; they are listed in order of increasing strength to get the job done.  They are all available over the counter, and you may need to use some, POSSIBLY even all of these options:    Drink plenty of fluids (prune juice may be helpful) and high fiber foods Colace 100 mg by mouth twice a day  Senokot for constipation as directed and as needed Dulcolax (bisacodyl), take with full glass of water  Miralax (polyethylene glycol) once or twice a day as  needed.  If you have tried all these things and are unable to have a bowel movement in the first 3-4 days after surgery call either your surgeon or your primary doctor.    If you experience loose stools or diarrhea, hold the medications until you stool forms back up.  If your symptoms do not get better within 1 week or if they get worse, check with your doctor.  If you experience "the worst abdominal pain ever" or develop nausea or vomiting, please contact the office immediately for further recommendations for treatment.   ITCHING:  If you experience itching with your medications, try taking only a single pain pill, or even half a pain pill at a time.  You can also use Benadryl over the counter for itching or also to help with sleep.   TED HOSE STOCKINGS:  Use stockings on both  legs until for at least 2 weeks or as directed by physician office. They may be removed at night for sleeping.  MEDICATIONS:  See your medication summary on the After Visit Summary that nursing will review with you.  You may have some home medications which will be placed on hold until you complete the course of blood thinner medication.  It is important for you to complete the blood thinner medication as prescribed.   Blood clot prevention (DVT Prophylaxis): After surgery you are at an increased risk for a blood clot. you were prescribed a blood thinner, Aspirin '81mg'$ , to be taken twice daily for a total of 4 weeks from surgery to help reduce your risk of getting a blood clot. This will help prevent a blood clot. Signs of a pulmonary embolus (blood clot in the lungs) include sudden short of breath, feeling lightheaded or dizzy, chest pain with a deep breath, rapid pulse rapid breathing. Signs of a blood clot in your arms or legs include new unexplained swelling and cramping, warm, red or darkened skin around the painful area. Please call the office or 911 right away if these signs or symptoms develop.  PRECAUTIONS:  If you  experience chest pain or shortness of breath - call 911 immediately for transfer to the hospital emergency department.   If you develop a fever greater that 101 F, purulent drainage from wound, increased redness or drainage from wound, foul odor from the wound/dressing, or calf pain - CONTACT YOUR SURGEON.                                                   FOLLOW-UP APPOINTMENTS:  If you do not already have a post-op appointment, please call the office for an appointment to be seen by your surgeon.  Guidelines for how soon to be seen are listed in your After Visit Summary, but are typically between 2-3 weeks after surgery.  OTHER INSTRUCTIONS:   Knee Replacement:  Do not place pillow under knee, focus on keeping the knee straight while resting.   POST-OPERATIVE OPIOID TAPER INSTRUCTIONS: It is important to wean off of your opioid medication as soon as possible. If you do not need pain medication after your surgery it is ok to stop day one. Opioids include: Codeine, Hydrocodone(Norco, Vicodin), Oxycodone(Percocet, oxycontin) and hydromorphone amongst others.  Long term and even short term use of opiods can cause: Increased pain response Dependence Constipation Depression Respiratory depression And more.  Withdrawal symptoms can include Flu like symptoms Nausea, vomiting And more Techniques to manage these symptoms Hydrate well Eat regular healthy meals Stay active Use relaxation techniques(deep breathing, meditating, yoga) Do Not substitute Alcohol to help with tapering If you have been on opioids for less than two weeks and do not have pain than it is ok to stop all together.  Plan to wean off of opioids This plan should start within one week post op of your joint replacement. Maintain the same interval or time between taking each dose and first decrease the dose.  Cut the total daily intake of opioids by one tablet each day Next start to increase the time between doses. The  last dose that should be eliminated is the evening dose.   MAKE SURE YOU:  Understand these instructions.  Get help right away if you are not doing well or get  worse.    Thank you for letting us be a part of your medical care team.  It is a privilege we respect greatly.  We hope these instructions will help you stay on track for a fast and full recovery!

## 2021-03-02 NOTE — Anesthesia Procedure Notes (Signed)
Spinal  Patient location during procedure: OR Start time: 03/02/2021 7:27 AM End time: 03/02/2021 7:36 AM Reason for block: surgical anesthesia Staffing Performed: anesthesiologist  Anesthesiologist: Myrtie Soman, MD Preanesthetic Checklist Completed: patient identified, IV checked, site marked, risks and benefits discussed, surgical consent, monitors and equipment checked, pre-op evaluation and timeout performed Spinal Block Patient position: sitting Prep: Betadine Patient monitoring: heart rate, continuous pulse ox and blood pressure Approach: midline Location: L3-4 Injection technique: single-shot Needle Needle type: Sprotte  Needle gauge: 24 G Needle length: 9 cm Assessment Sensory level: T6 Events: CSF return and second provider Additional Notes

## 2021-03-02 NOTE — Anesthesia Preprocedure Evaluation (Signed)
Anesthesia Evaluation  Patient identified by MRN, date of birth, ID band Patient awake    Reviewed: Allergy & Precautions, NPO status , Patient's Chart, lab work & pertinent test results  Airway Mallampati: II  TM Distance: >3 FB Neck ROM: Full    Dental no notable dental hx.    Pulmonary neg pulmonary ROS,    Pulmonary exam normal breath sounds clear to auscultation       Cardiovascular hypertension, Normal cardiovascular exam Rhythm:Regular Rate:Normal     Neuro/Psych negative neurological ROS  negative psych ROS   GI/Hepatic negative GI ROS, Neg liver ROS,   Endo/Other  negative endocrine ROS  Renal/GU negative Renal ROS  negative genitourinary   Musculoskeletal negative musculoskeletal ROS (+)   Abdominal   Peds negative pediatric ROS (+)  Hematology negative hematology ROS (+)   Anesthesia Other Findings   Reproductive/Obstetrics negative OB ROS                             Anesthesia Physical Anesthesia Plan  ASA: 2  Anesthesia Plan: Spinal   Post-op Pain Management: Regional block*   Induction: Intravenous  PONV Risk Score and Plan: 2 and Propofol infusion, Treatment may vary due to age or medical condition, Ondansetron and Dexamethasone  Airway Management Planned: Simple Face Mask  Additional Equipment:   Intra-op Plan:   Post-operative Plan:   Informed Consent: I have reviewed the patients History and Physical, chart, labs and discussed the procedure including the risks, benefits and alternatives for the proposed anesthesia with the patient or authorized representative who has indicated his/her understanding and acceptance.     Dental advisory given  Plan Discussed with: CRNA and Surgeon  Anesthesia Plan Comments:         Anesthesia Quick Evaluation

## 2021-03-02 NOTE — Anesthesia Procedure Notes (Signed)
Anesthesia Regional Block: Adductor canal block   Pre-Anesthetic Checklist: , timeout performed,  Correct Patient, Correct Site, Correct Laterality,  Correct Procedure, Correct Position, site marked,  Risks and benefits discussed,  Surgical consent,  Pre-op evaluation,  At surgeon's request and post-op pain management  Laterality: Right  Prep: chloraprep       Needles:  Injection technique: Single-shot  Needle Type: Echogenic Needle     Needle Length: 9cm      Additional Needles:   Procedures:,,,, ultrasound used (permanent image in chart),,    Narrative:  Start time: 03/02/2021 6:54 AM End time: 03/02/2021 7:00 AM Injection made incrementally with aspirations every 5 mL.  Performed by: Personally  Anesthesiologist: Myrtie Soman, MD  Additional Notes: Patient tolerated the procedure well without complications

## 2021-03-03 ENCOUNTER — Encounter (HOSPITAL_COMMUNITY): Payer: Self-pay | Admitting: Orthopedic Surgery

## 2021-03-05 ENCOUNTER — Ambulatory Visit: Payer: Medicaid Other | Attending: Orthopedic Surgery

## 2021-03-05 ENCOUNTER — Other Ambulatory Visit: Payer: Self-pay

## 2021-03-05 DIAGNOSIS — M25561 Pain in right knee: Secondary | ICD-10-CM | POA: Diagnosis present

## 2021-03-05 DIAGNOSIS — G8929 Other chronic pain: Secondary | ICD-10-CM | POA: Diagnosis present

## 2021-03-05 DIAGNOSIS — M25661 Stiffness of right knee, not elsewhere classified: Secondary | ICD-10-CM | POA: Insufficient documentation

## 2021-03-05 DIAGNOSIS — R2689 Other abnormalities of gait and mobility: Secondary | ICD-10-CM | POA: Insufficient documentation

## 2021-03-05 DIAGNOSIS — M6281 Muscle weakness (generalized): Secondary | ICD-10-CM | POA: Insufficient documentation

## 2021-03-05 NOTE — Therapy (Signed)
?OUTPATIENT PHYSICAL THERAPY TREATMENT NOTE/RE-EVALUATION ? ? ?Patient Name: Matthew Stark ?MRN: [id number] ?DOB:[birth year], 61 y.o., male ?Today's Date: 03/05/2021 ? ?PCP: [facility] ?REFERRING PROVIDER: Willaim Sheng, MD ? ? PT End of Session - 03/05/21 1037   ? ? Visit Number 8   ? Number of Visits 24   ? Date for PT Re-Evaluation 04/30/21   ? Authorization Type [employer]   ? Authorization Time Period 01/28/2021- 03/27/2021   ? Authorization - Visit Number 8   ? Authorization - Number of Visits 16   ? PT Start Time 1045   ? PT Stop Time 1123   10 minutes of game ready post session  ? PT Time Calculation (min) 38 min   ? Activity Tolerance Patient tolerated treatment well   ? Behavior During Therapy [facility] for tasks assessed/performed   ? ?  ?  ? ?  ? ? ? ? ? ? ?Past Medical History:  ?Diagnosis Date  ? Arthritis   ? Hypertension   ? ?Past Surgical History:  ?Procedure Laterality Date  ? MOUTH SURGERY    ? 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 Procedure: TOTAL KNEE ARTHROPLASTY;  Surgeon: Willaim Sheng, MD;  Location: [facility];  Service: Orthopedics;  Laterality: Right;  ? ?There are no problems to display for this patient. ? ? ?REFERRING DIAG: Post OP right total knee replacement 03/02/21 ? ?THERAPY DIAG:  ?Chronic pain of right knee ? ?Other abnormalities of gait and mobility ? ?PERTINENT HISTORY: Rt knee TKA 03/02/2021 ? ?SUBJECTIVE:  ?Pt presents to PT for re-evaluation s/p R TKA performed on 03/02/2021. He notes a lot of pains since surgery, has been using a FWW for mobility. Is ready to begin PT treatment at this time. ? ?PAIN:  ?Are you having pain? Yes ?NPRS scale: 8/10 (10/10 at worst post surgery) ?Pain location: Knee ?Pain orientation: Right  ?PAIN TYPE: aching ?Pain description: intermittent  ? ? ?OBJECTIVE:  ?Outcomes: ?TUG: 65" w/ FWW ?30 Second Sit to Stand: 5 reps w/ UE support ? ?LE AROM/PROM: ?  ?A/PROM Right ?01/24/2021 Right ?02/05/2021 Right ?03/05/2021  ?Knee flexion 125/127p!  140/145 60  ?Knee extension -12/ -8p! 12 20  ? (Blank rows = not tested) ?  ?LE MMT: ?  ?MMT Right ?01/24/2021 Left ?01/24/2021 Right ?02/19/2021 Right  ?03/05/2021  ?Hip flexion 4+/5 5/5    ?Hip extension 3+/5 4/5    ?Hip abduction 4/5 4/5    ?Knee flexion 4+/5 5/5 5/5 DNT  ?Knee extension 3+/5 with pain 5/5 4/5 with pain DNT  ? (Blank rows = not tested) ?  ?  ? ?TODAY'S TREATMENT: ?[facility] Adult PT Treatment:                                                DATE: 03/05/2021 ?Therapeutic Exercise: ?Seated hamstring stretch x 30" R ?Seated heel slide x 5 - R ?Supine quad set 3x10 - 5" R ?Supine heel slide with stretch 3x10 - 5" hold R ?Therapeutic Activity: ?Assessment of tests/measures, goals, and outcomes for re-eval post R TKA ?Modalities: ?Gameready x 10 min R knee post session ?36 deg ?Low compression ? ? ?PATIENT EDUCATION:  ?Education details: Educated on POC, prognosis, and HEP ?Person educated: Patient ?Education method: Explanation, Demonstration, and Handouts ?Education comprehension: verbalized understanding and returned demonstration ?  ?  ?GAIT: ?  Distance walked: 40ft ?Assistive device utilized: Environmental consultant - 2 wheeled ?Level of assistance: Modified independence ?Comments: step-to gait with decreased knee extension, slowed gait speed ? ?HOME EXERCISE PROGRAM: ?Access Code: [email or web address] ?URL: [email or web address] ?Date: 03/05/2021 ?Prepared by: Octavio Manns ? ?Exercises ?Long Sitting Quad Set with Towel Roll Under Heel - 8 x daily - 7 x weekly - 2-3 sets - 10 reps - 5 sec hold ?Supine Heel Slide with Strap - 8 x daily - 7 x weekly - 2-3 sets - 10 reps - 5-10 sec hold ?Seated Heel Slide - 5 x daily - 7 x weekly - 2-3 sets - 10 reps - 5 sec hold ?Seated Hamstring Stretch - 1 x daily - 7 x weekly - 3 sets - 10 reps ? ?  ?ASSESSMENT: ?  ?CLINICAL IMPRESSION: ?Pt was able to complete prescribed exercises post surgery, focusing on improving R knee ROM post surgery. Physical findings are consistent with surgery and  recovery timeline, as pt has decrease in functional mobility and R knee ROM. His TUG and 30 Second Sit to Stand scores indicate he is operating at a very high fall risk. Pt range may be limited secondary to decreased range and mobility prior to surgery. He would benefit from continued skilled therapy services, working on improving strength, R knee ROM, and gait post R TKA. Will continue to progress as tolerated per POC.  ?  ?  ?GOALS: ?Goals reviewed with patient? Yes ?  ? ?LONG TERM GOALS:  ?  ?LTG Name Target Date Goal status  ?1 Pt will self report R knee pain no greater than 2/10 for improved comfort and functional ability ?Baseline: 10/10 at worst 04/30/2021 UPDATED ?INITIAL  ?2 Pt will improve R knee ROM to no less than range of 5-110 degrees post surgery for improving functional mobility ?Baseline: 20-60 degrees 04/30/2021 UPDATED ?INITIAL  ?3 Pt will increase 30 Second Sit to Stand rep count to no less than 10 reps for improved balance, strength, and functional mobility ?Baseline: 5 reps - with UE support 04/30/2021 UPDATED ?INITIAL  ?4 Pt will decrease TUG time to no greater than 20 seconds with LRAD for improved balance and functional mobility ?Baseline: 65 sec with FWW 04/30/2021 UPDATED ?INITIAL  ?  ?PLAN: ?PT FREQUENCY: 2x/week ?  ?PT DURATION: 8 weeks ?  ?PLANNED INTERVENTIONS: Therapeutic exercises, Therapeutic activity, Neuro Muscular re-education, Balance training, Gait training, Patient/Family education, Joint mobilization, Stair training, Aquatic Therapy, Dry Needling, Cryotherapy, Moist heat, Taping, and Manual therapy ? ?Check all possible CPT codes: 97110- Therapeutic Exercise, [phone]- Neuro Re-education, [phone] - Gait Training, [phone] - Manual Therapy, 97530 - Therapeutic Activities, [phone] - Self Care, and [phone] - Vaso   ? ?Ward Chatters, PT ?03/05/21 12:11 PM ? ? ? ?

## 2021-03-09 ENCOUNTER — Other Ambulatory Visit: Payer: Self-pay

## 2021-03-09 ENCOUNTER — Ambulatory Visit: Payer: Medicaid Other

## 2021-03-09 DIAGNOSIS — R2689 Other abnormalities of gait and mobility: Secondary | ICD-10-CM

## 2021-03-09 DIAGNOSIS — M25561 Pain in right knee: Secondary | ICD-10-CM | POA: Diagnosis not present

## 2021-03-09 DIAGNOSIS — G8929 Other chronic pain: Secondary | ICD-10-CM

## 2021-03-09 NOTE — Therapy (Signed)
?OUTPATIENT PHYSICAL THERAPY TREATMENT NOTE/RE-EVALUATION ? ? ?Patient Name: Matthew Stark ?MRN: [id number] ?DOB:19-Aug-1960, 61 y.o., male ?Today's Date: 03/09/2021 ? ?PCP: [facility] ?REFERRING PROVIDER: [facility], [facility] ? ? PT End of Session - 03/09/21 1126   ? ? Visit Number 9   ? Number of Visits 24   ? Date for PT Re-Evaluation 04/30/21   ? Authorization Type [employer]   ? Authorization Time Period 01/28/2021- 03/27/2021   ? Authorization - Visit Number 8   ? Authorization - Number of Visits 16   ? PT Start Time 1127   ? PT Stop Time 1212   additonal 10 min ice post session  ? PT Time Calculation (min) 45 min   ? Activity Tolerance Patient tolerated treatment well   ? Behavior During Therapy [facility] for tasks assessed/performed   ? ?  ?  ? ?  ? ? ? ? ? ? ? ?Past Medical History:  ?Diagnosis Date  ? Arthritis   ? Hypertension   ? ?Past Surgical History:  ?Procedure Laterality Date  ? MOUTH SURGERY    ? 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 Procedure: TOTAL KNEE ARTHROPLASTY;  Surgeon: Willaim Sheng, MD;  Location: [facility];  Service: Orthopedics;  Laterality: Right;  ? ?There are no problems to display for this patient. ? ? ?REFERRING DIAG: Post OP right total knee replacement 03/02/21 ? ?THERAPY DIAG:  ?Chronic pain of right knee ? ?Other abnormalities of gait and mobility ? ?PERTINENT HISTORY: Rt knee TKA 03/02/2021 ? ?SUBJECTIVE:  ?Pt presents to PT with reports of continued R knee pain post surgery. Has been compliant with his HEP with no adverse effects noted thus far. Pt is ready to begin PT a this time. ? ?Pain: ?Are you having pain? Yes ?NPRS scale: 8/10 (10/10 at worst post surgery) ?Pain location: Knee ?Pain orientation: Right  ?PAIN TYPE: aching ?Pain description: intermittent  ? ? ?OBJECTIVE:  ?Outcomes: ?TUG: 65" w/ FWW ?30 Second Sit to Stand: 5 reps w/ UE support ? ?LE AROM/PROM: ?  ?A/PROM Right ?01/24/2021 Right ?02/05/2021 Right ?03/05/2021 Right ?03/09/2021  ?Knee flexion 125/127p!  140/145 60 75  ?Knee extension -12/ -8p! '12 20 16  '$ ? (Blank rows = not tested) ?  ?LE MMT: ?  ?MMT Right ?01/24/2021 Left ?01/24/2021 Right ?02/19/2021 Right  ?03/05/2021  ?Hip flexion 4+/5 5/5    ?Hip extension 3+/5 4/5    ?Hip abduction 4/5 4/5    ?Knee flexion 4+/5 5/5 5/5 DNT  ?Knee extension 3+/5 with pain 5/5 4/5 with pain DNT  ? (Blank rows = not tested) ?  ?  ? ?TODAY'S TREATMENT: ?[facility] Adult PT Treatment:                                                DATE: 03/09/2021 ?Therapeutic Exercise: ?NuStep lvl 5 UE/LE x 5 min for R knee stretch ?Seated heel slide R 3x10 - 5" hold ?Seated hamstring stretch 2x30" R ?Supine quad set with towel under knee 2x10 - 5" hold ?Supine heel slide with stretch 3x10 - 5" hold R ?Manual Therapy: ?AP mobs to R knee for improving extension Grade II ?Modalities: ?Ice to R knee with R LE elevated x 10 min post session ? ?[facility] Adult PT Treatment:  DATE: 03/05/2021 ?Therapeutic Exercise: ?Seated hamstring stretch x 30" R ?Seated heel slide x 5 - R ?Supine quad set 3x10 - 5" R ?Supine heel slide with stretch 3x10 - 5" hold R ?Therapeutic Activity: ?Assessment of tests/measures, goals, and outcomes for re-eval post R TKA ?Modalities: ?Gameready x 10 min R knee post session ?36 deg ?Low compression ? ? ?PATIENT EDUCATION:  ?Education details: Educated on POC, prognosis, and HEP ?Person educated: Patient ?Education method: Explanation, Demonstration, and Handouts ?Education comprehension: verbalized understanding and returned demonstration ?  ?  ?GAIT: ?Distance walked: 58f ?Assistive device utilized: WEnvironmental consultant- 2 wheeled ?Level of assistance: Modified independence ?Comments: step-to gait with decreased knee extension, slowed gait speed ? ?HOME EXERCISE PROGRAM: ?Access Code: [email or web address]?URL: [email or web address] ?Date: 03/05/2021 ?Prepared by: DOctavio Manns? ?Exercises ?Long Sitting Quad Set with Towel Roll Under Heel - 8 x daily - 7 x  weekly - 2-3 sets - 10 reps - 5 sec hold ?Supine Heel Slide with Strap - 8 x daily - 7 x weekly - 2-3 sets - 10 reps - 5-10 sec hold ?Seated Heel Slide - 5 x daily - 7 x weekly - 2-3 sets - 10 reps - 5 sec hold ?Seated Hamstring Stretch - 1 x daily - 7 x weekly - 3 sets - 10 reps ? ?  ?ASSESSMENT: ?  ?CLINICAL IMPRESSION: ?Pt was able to complete all prescribed exercises, but continues to have significant R knee stiffness post TKA. Therapy today focused on improving R quad strength and knee ROM post surgery, with slight improvement in flex/extension noted. Pt continues to benefit from skilled PT services and will continue to be seen and progressed as tolerated.  ?  ?  ?GOALS: ?Goals reviewed with patient? Yes ?  ? ?LONG TERM GOALS:  ?  ?LTG Name Target Date Goal status  ?1 Pt will self report R knee pain no greater than 2/10 for improved comfort and functional ability ?Baseline: 10/10 at worst 04/30/2021 UPDATED ?INITIAL  ?2 Pt will improve R knee ROM to no less than range of 5-110 degrees post surgery for improving functional mobility ?Baseline: 20-60 degrees 04/30/2021 UPDATED ?INITIAL  ?3 Pt will increase 30 Second Sit to Stand rep count to no less than 10 reps for improved balance, strength, and functional mobility ?Baseline: 5 reps - with UE support 04/30/2021 UPDATED ?INITIAL  ?4 Pt will decrease TUG time to no greater than 20 seconds with LRAD for improved balance and functional mobility ?Baseline: 65 sec with FWW 04/30/2021 UPDATED ?INITIAL  ?  ?PLAN: ?PT FREQUENCY: 2x/week ?  ?PT DURATION: 8 weeks ?  ?PLANNED INTERVENTIONS: Therapeutic exercises, Therapeutic activity, Neuro Muscular re-education, Balance training, Gait training, Patient/Family education, Joint mobilization, Stair training, Aquatic Therapy, Dry Needling, Cryotherapy, Moist heat, Taping, and Manual therapy ? ?Check all possible CPT codes: 97110- Therapeutic Exercise, [phone] Neuro Re-education, [phone]- Gait Training, [phone]- Manual Therapy, 97530  - Therapeutic Activities, [phone]- Self Care, and [phone]- Vaso   ? ?DWard Chatters PT ?03/09/21 12:16 PM ? ? ? ?

## 2021-03-12 ENCOUNTER — Ambulatory Visit: Payer: Medicaid Other

## 2021-03-12 ENCOUNTER — Other Ambulatory Visit: Payer: Self-pay

## 2021-03-12 DIAGNOSIS — M25661 Stiffness of right knee, not elsewhere classified: Secondary | ICD-10-CM

## 2021-03-12 DIAGNOSIS — M6281 Muscle weakness (generalized): Secondary | ICD-10-CM

## 2021-03-12 DIAGNOSIS — R2689 Other abnormalities of gait and mobility: Secondary | ICD-10-CM

## 2021-03-12 DIAGNOSIS — G8929 Other chronic pain: Secondary | ICD-10-CM

## 2021-03-12 DIAGNOSIS — M25561 Pain in right knee: Secondary | ICD-10-CM | POA: Diagnosis not present

## 2021-03-12 NOTE — Therapy (Signed)
?OUTPATIENT PHYSICAL THERAPY TREATMENT NOTE/RE-EVALUATION ? ? ?Patient Name: Matthew Stark ?MRN: [id number] ?DOB:1960/01/10, 61 y.o., male ?Today's Date: 03/12/2021 ? ?PCP: [facility], [facility] ?REFERRING PROVIDER: Willaim Sheng, MD ? ? PT End of Session - 03/12/21 1119   ? ? Visit Number 10   ? Number of Visits 24   ? Date for PT Re-Evaluation 04/30/21   ? Authorization Type [employer]   ? Authorization Time Period 01/28/2021- 03/27/2021   ? Authorization - Visit Number 9   ? Authorization - Number of Visits 16   ? PT Start Time 1120   ? PT Stop Time 1200   ? PT Time Calculation (min) 40 min   ? Equipment Utilized During Treatment --   ? Activity Tolerance Patient tolerated treatment well   ? Behavior During Therapy [facility] for tasks assessed/performed   ? ?  ?  ? ?  ? ? ? ? ? ? ? ? ?Past Medical History:  ?Diagnosis Date  ? Arthritis   ? Hypertension   ? ?Past Surgical History:  ?Procedure Laterality Date  ? MOUTH SURGERY    ? 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 Procedure: TOTAL KNEE ARTHROPLASTY;  Surgeon: Willaim Sheng, MD;  Location: [facility];  Service: Orthopedics;  Laterality: Right;  ? ?There are no problems to display for this patient. ? ? ?REFERRING DIAG: Post OP right total knee replacement 03/02/21 ? ?THERAPY DIAG:  ?Chronic pain of right knee ? ?Other abnormalities of gait and mobility ? ?Muscle weakness (generalized) ? ?Stiffness of right knee, not elsewhere classified ? ?PERTINENT HISTORY: Rt knee TKA 03/02/2021 ? ?SUBJECTIVE: I can't sleep because of the pain. I'm doing the exercises at home. ? ? ?Pain: ?Are you having pain? Yes ?NPRS scale: 6/10 (10/10 at worst post surgery) ?Pain location: Knee ?Pain orientation: Right  ?PAIN TYPE: aching ?Pain description: intermittent  ? ? ?OBJECTIVE:  ?Outcomes: ?TUG: 65" w/ FWW ?30 Second Sit to Stand: 5 reps w/ UE support ? ?LE AROM/PROM: ?  ?A/PROM Right ?01/24/2021 Right ?02/05/2021 Right ?03/05/2021 Right ?03/09/2021 Right ?03/12/2021  ?Knee flexion  125/127p! 140/145 60 75 83  ?Knee extension -12/ -8p! '12 20 16 15  '$ ? (Blank rows = not tested) ?  ?LE MMT: ?  ?MMT Right ?01/24/2021 Left ?01/24/2021 Right ?02/19/2021 Right  ?03/05/2021  ?Hip flexion 4+/5 5/5    ?Hip extension 3+/5 4/5    ?Hip abduction 4/5 4/5    ?Knee flexion 4+/5 5/5 5/5 DNT  ?Knee extension 3+/5 with pain 5/5 4/5 with pain DNT  ? (Blank rows = not tested) ?  ?  ? ?TODAY'S TREATMENT: ?[facility] Adult PT Treatment:                                                DATE: 03/12/2021 ?Therapeutic Exercise: ?NuStep lvl 5 UE/LE x 6 min for R knee stretch and gathering subjective info ?Seated heel slide R 3x10 - 5" hold ?Seated hamstring stretch 2x30" R ?Supine quad set with towel under knee x15 - 5" hold ?Supine heel slide with stretch 3x10 - 5" hold R, utilizing strap ?Supine LLLD stretch for ext, heel on 1/2 foam roll, 3# above knee x5 mins ?Manual Therapy: ?AP mobs to R knee for improving extension Grade II ?Modalities: ?Ice to R knee with R LE elevated x 10 min post session ? ? ?  [facility] Adult PT Treatment:                                                DATE: 03/09/2021 ?Therapeutic Exercise: ?NuStep lvl 5 UE/LE x 5 min for R knee stretch ?Seated heel slide R 3x10 - 5" hold ?Seated hamstring stretch 2x30" R ?Supine quad set with towel under knee 2x10 - 5" hold ?Supine heel slide with stretch 3x10 - 5" hold R ?Manual Therapy: ?AP mobs to R knee for improving extension Grade II ?Modalities: ?Ice to R knee with R LE elevated x 10 min post session ? ?[facility] Adult PT Treatment:                                                DATE: 03/05/2021 ?Therapeutic Exercise: ?Seated hamstring stretch x 30" R ?Seated heel slide x 5 - R ?Supine quad set 3x10 - 5" R ?Supine heel slide with stretch 3x10 - 5" hold R ?Therapeutic Activity: ?Assessment of tests/measures, goals, and outcomes for re-eval post R TKA ?Modalities: ?Gameready x 10 min R knee post session ?36 deg ?Low compression ? ? ?PATIENT EDUCATION:  ?Education details: Educated on  POC, prognosis, and HEP ?Person educated: Patient ?Education method: Explanation, Demonstration, and Handouts ?Education comprehension: verbalized understanding and returned demonstration ?  ?  ?GAIT: ?Distance walked: 56f ?Assistive device utilized: WEnvironmental consultant- 2 wheeled ?Level of assistance: Modified independence ?Comments: step-to gait with decreased knee extension, slowed gait speed ? ?HOME EXERCISE PROGRAM: ?Access Code: [email or web address]?URL: [email or web address] ?Date: 03/05/2021 ?Prepared by: DOctavio Manns? ?Exercises ?Long Sitting Quad Set with Towel Roll Under Heel - 8 x daily - 7 x weekly - 2-3 sets - 10 reps - 5 sec hold ?Supine Heel Slide with Strap - 8 x daily - 7 x weekly - 2-3 sets - 10 reps - 5-10 sec hold ?Seated Heel Slide - 5 x daily - 7 x weekly - 2-3 sets - 10 reps - 5 sec hold ?Seated Hamstring Stretch - 1 x daily - 7 x weekly - 3 sets - 10 reps ? ?  ?ASSESSMENT: ?  ?CLINICAL IMPRESSION: ?Patient presents to PT with moderate to high levels of pain in the right knee and reports HEP compliance. He was able to complete all prescribed exercises with no adverse effects, but still has significant right knee stiffness. Today's session focused on improving right quad strength and knee ROM post TKA with slight improvement in flexion and extension. Patient continues to benefit from skilled PT services and should be progressed as able to improve functional independence. ?  ?  ?GOALS: ?Goals reviewed with patient? Yes ?  ? ?LONG TERM GOALS:  ?  ?LTG Name Target Date Goal status  ?1 Pt will self report R knee pain no greater than 2/10 for improved comfort and functional ability ?Baseline: 10/10 at worst 04/30/2021 UPDATED ?INITIAL  ?2 Pt will improve R knee ROM to no less than range of 5-110 degrees post surgery for improving functional mobility ?Baseline: 20-60 degrees 04/30/2021 UPDATED ?INITIAL  ?3 Pt will increase 30 Second Sit to Stand rep count to no less than 10 reps for improved balance,  strength, and functional mobility ?Baseline: 5 reps - with UE support 04/30/2021  UPDATED ?INITIAL  ?4 Pt will decrease TUG time to no greater than 20 seconds with LRAD for improved balance and functional mobility ?Baseline: 65 sec with FWW 04/30/2021 UPDATED ?INITIAL  ?  ?PLAN: ?PT FREQUENCY: 2x/week ?  ?PT DURATION: 8 weeks ?  ?PLANNED INTERVENTIONS: Therapeutic exercises, Therapeutic activity, Neuro Muscular re-education, Balance training, Gait training, Patient/Family education, Joint mobilization, Stair training, Aquatic Therapy, Dry Needling, Cryotherapy, Moist heat, Taping, and Manual therapy ? ?Check all possible CPT codes: 22449- Therapeutic Exercise, [phone]- Neuro Re-education, [phone] - Gait Training, [phone] - Manual Therapy, 97530 - Therapeutic Activities, [phone] - Self Care, and [phone] - Vaso   ? ? ?Evelene Croon, PTA ?03/12/21 12:03 PM ? ? ? ? ?

## 2021-03-16 ENCOUNTER — Other Ambulatory Visit: Payer: Self-pay

## 2021-03-16 ENCOUNTER — Ambulatory Visit: Payer: Medicaid Other

## 2021-03-16 DIAGNOSIS — M25561 Pain in right knee: Secondary | ICD-10-CM | POA: Diagnosis not present

## 2021-03-16 DIAGNOSIS — G8929 Other chronic pain: Secondary | ICD-10-CM

## 2021-03-16 DIAGNOSIS — M6281 Muscle weakness (generalized): Secondary | ICD-10-CM

## 2021-03-16 DIAGNOSIS — R2689 Other abnormalities of gait and mobility: Secondary | ICD-10-CM

## 2021-03-16 NOTE — Therapy (Signed)
?OUTPATIENT PHYSICAL THERAPY TREATMENT NOTE/RE-EVALUATION ? ? ?Patient Name: Matthew Stark ?MRN: [id number] ?DOB:1960/09/23, 61 y.o., male ?Today's Date: 03/16/2021 ? ?PCP: [facility] ?REFERRING PROVIDER: Willaim Sheng, MD ? ? PT End of Session - 03/16/21 1125   ? ? Visit Number 11   ? Number of Visits 24   ? Date for PT Re-Evaluation 04/30/21   ? Authorization Type [employer]   ? Authorization Time Period 01/28/2021- 03/27/2021   ? Authorization - Visit Number 9   ? Authorization - Number of Visits 16   ? PT Start Time 1130   ? PT Stop Time 1208   ? PT Time Calculation (min) 38 min   ? Activity Tolerance Patient tolerated treatment well   ? Behavior During Therapy [facility] for tasks assessed/performed   ? ?  ?  ? ?  ? ? ? ? ? ? ? ? ? ?Past Medical History:  ?Diagnosis Date  ? Arthritis   ? Hypertension   ? ?Past Surgical History:  ?Procedure Laterality Date  ? MOUTH SURGERY    ? 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 Procedure: TOTAL KNEE ARTHROPLASTY;  Surgeon: Willaim Sheng, MD;  Location: [facility];  Service: Orthopedics;  Laterality: Right;  ? ?There are no problems to display for this patient. ? ? ?REFERRING DIAG: Post OP right total knee replacement 03/02/21 ? ?THERAPY DIAG:  ?Chronic pain of right knee ? ?Other abnormalities of gait and mobility ? ?Muscle weakness (generalized) ? ?PERTINENT HISTORY: Rt knee TKA 03/02/2021 ? ?SUBJECTIVE:  ?Pt presents to PT with reports of continued severe R knee pain. Also notes pain in R heel/calf, denies this is a new symptom and states this has been ongoing since surgery. He has continued to wear compression garments and perform HEP at home.   ? ?Pain: ?Are you having pain? Yes ?NPRS scale: 6/10 (10/10 at worst post surgery) ?Pain location: Knee ?Pain orientation: Right  ?PAIN TYPE: aching ?Pain description: intermittent  ? ? ?OBJECTIVE:  ?Outcomes: ?TUG: 65" w/ FWW ?30 Second Sit to Stand: 5 reps w/ UE support ? ?LE AROM/PROM: ?  ?A/PROM  Right ?01/24/2021 Right ?02/05/2021 Right ?03/05/2021 Right ?03/09/2021 Right ?03/12/2021 Right ?03/16/2021  ?Knee flexion 125/127p! 140/145 60 75 83 85  ?Knee extension -12/ -8p! '12 20 16 15 10 '$ (post LLLD stretch)  ? (Blank rows = not tested) ?  ?LE MMT: ?  ?MMT Right ?01/24/2021 Left ?01/24/2021 Right ?02/19/2021 Right  ?03/05/2021  ?Hip flexion 4+/5 5/5    ?Hip extension 3+/5 4/5    ?Hip abduction 4/5 4/5    ?Knee flexion 4+/5 5/5 5/5 DNT  ?Knee extension 3+/5 with pain 5/5 4/5 with pain DNT  ? (Blank rows = not tested) ?  ?  ?PALPATION: ? TTP, warmth, and stiffness to R calf ? ?TODAY'S TREATMENT: ?[facility] Adult PT Treatment:                                                DATE: 03/16/2021 ?Therapeutic Exercise: ?NuStep lvl 6 UE/LE x 5 min for R knee stretch and gathering subjective info ?Seated hamstring stretch 2x30" R ?Supine quad set with towel under heel 2x10 - 5" hold ?Supine heel slide with stretch 3x10 - 5" hold R, utilizing strap ?Supine LLLD stretch for ext, heel on 1/2 foam roll, 3# above knee x 4  mins ?Supine hamstring stretch with strap 3x30" hold ?Manual Therapy: ?AP mobs to R knee for improving extension Grade II ?Modalities: ?Ice to R knee with R LE elevated x 8 min post session ? ?[facility] Adult PT Treatment:                                                DATE: 03/12/2021 ?Therapeutic Exercise: ?NuStep lvl 5 UE/LE x 6 min for R knee stretch and gathering subjective info ?Seated heel slide R 3x10 - 5" hold ?Seated hamstring stretch 2x30" R ?Supine quad set with towel under knee x15 - 5" hold ?Supine heel slide with stretch 3x10 - 5" hold R, utilizing strap ?Supine LLLD stretch for ext, heel on 1/2 foam roll, 3# above knee x5 mins ?Manual Therapy: ?AP mobs to R knee for improving extension Grade II ?Modalities: ?Ice to R knee with R LE elevated x 10 min post session ? ? ?PATIENT EDUCATION:  ?Education details: Educated on POC, prognosis, and HEP ?Person educated: Patient ?Education method: Explanation, Demonstration, and  Handouts ?Education comprehension: verbalized understanding and returned demonstration ?  ?  ?GAIT: ?Distance walked: 31f ?Assistive device utilized: WEnvironmental consultant- 2 wheeled ?Level of assistance: Modified independence ?Comments: step-to gait with decreased knee extension, slowed gait speed ? ?HOME EXERCISE PROGRAM: ?Access Code: [email or web address]?URL: [email or web address] ?Date: 03/05/2021 ?Prepared by: DOctavio Manns? ?Exercises ?Long Sitting Quad Set with Towel Roll Under Heel - 8 x daily - 7 x weekly - 2-3 sets - 10 reps - 5 sec hold ?Supine Heel Slide with Strap - 8 x daily - 7 x weekly - 2-3 sets - 10 reps - 5-10 sec hold ?Seated Heel Slide - 5 x daily - 7 x weekly - 2-3 sets - 10 reps - 5 sec hold ?Seated Hamstring Stretch - 1 x daily - 7 x weekly - 3 sets - 10 reps ? ?  ?ASSESSMENT: ?  ?CLINICAL IMPRESSION: ?Pt tolerated treatment fair today, but continued to have significant R knee pain during treatment. He continues to show improvement in R knee flexion, as noted above. Of concern, pt noted R calf pain with PT palpating warmth and stiffness in this muscle. Pt noted that his has been present since surgery, however, PT called and left message with MD updating him on these symptoms, as pt does not have f/u visit until 03/19/2021. PT educated pt on symptoms of DVT and explained that his MD may reach out before f/u visit. Pt continues to require skilled PT services and will continue to be seen and progressed as tolerated.  ?  ?  ?GOALS: ?Goals reviewed with patient? Yes ?  ? ?LONG TERM GOALS:  ?  ?LTG Name Target Date Goal status  ?1 Pt will self report R knee pain no greater than 2/10 for improved comfort and functional ability ?Baseline: 10/10 at worst 04/30/2021 UPDATED ?INITIAL  ?2 Pt will improve R knee ROM to no less than range of 5-110 degrees post surgery for improving functional mobility ?Baseline: 20-60 degrees 04/30/2021 UPDATED ?INITIAL  ?3 Pt will increase 30 Second Sit to Stand rep count to no less  than 10 reps for improved balance, strength, and functional mobility ?Baseline: 5 reps - with UE support 04/30/2021 UPDATED ?INITIAL  ?4 Pt will decrease TUG time to no greater than 20 seconds with LRAD for improved balance and functional  mobility ?Baseline: 65 sec with FWW 04/30/2021 UPDATED ?INITIAL  ?  ?PLAN: ?PT FREQUENCY: 2x/week ?  ?PT DURATION: 8 weeks ?  ?PLANNED INTERVENTIONS: Therapeutic exercises, Therapeutic activity, Neuro Muscular re-education, Balance training, Gait training, Patient/Family education, Joint mobilization, Stair training, Aquatic Therapy, Dry Needling, Cryotherapy, Moist heat, Taping, and Manual therapy ? ?Check all possible CPT codes: 97110- Therapeutic Exercise, [id number]- Neuro Re-education, [phone] - Gait Training, [phone] - Manual Therapy, 97530 - Therapeutic Activities, [phone] - Self Care, and [phone] - Vaso   ? ?Ward Chatters, PT ?03/16/21 12:57 PM ? ? ? ? ?

## 2021-03-17 NOTE — Therapy (Signed)
?OUTPATIENT PHYSICAL THERAPY TREATMENT NOTE/RE-EVALUATION ? ? ?Patient Name: Matthew Stark ?MRN: [id number] ?DOB:Jan 15, 1960, 61 y.o., male ?Today's Date: 03/19/2021 ? ?PCP: [facility] ?REFERRING PROVIDER: Willaim Sheng, MD ? ? PT End of Session - 03/19/21 1129   ? ? Visit Number 12   ? Number of Visits 24   ? Date for PT Re-Evaluation 04/30/21   ? Authorization Type [employer]   ? Authorization Time Period 01/28/2021- 03/27/2021   ? Authorization - Visit Number 10   ? Authorization - Number of Visits 16   ? PT Start Time 1130   ? PT Stop Time 1210   ? PT Time Calculation (min) 40 min   ? Activity Tolerance Patient tolerated treatment well   ? Behavior During Therapy [facility] for tasks assessed/performed   ? ?  ?  ? ?  ? ? ? ? ? ? ? ? ? ? ?Past Medical History:  ?Diagnosis Date  ? Arthritis   ? Hypertension   ? ?Past Surgical History:  ?Procedure Laterality Date  ? MOUTH SURGERY    ? 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 Procedure: TOTAL KNEE ARTHROPLASTY;  Surgeon: Willaim Sheng, MD;  Location: [facility];  Service: Orthopedics;  Laterality: Right;  ? ?There are no problems to display for this patient. ? ? ?REFERRING DIAG: Post OP right total knee replacement 03/02/21 ? ?THERAPY DIAG:  ?Chronic pain of right knee ? ?Other abnormalities of gait and mobility ? ?Muscle weakness (generalized) ? ?Stiffness of right knee, not elsewhere classified ? ?PERTINENT HISTORY: Rt knee TKA 03/02/2021 ? ?SUBJECTIVE: Pt presents to PT with reports of continued severe R knee pain. Also notes pain in R heel/calf, denies this is a new symptom and states this has been ongoing since surgery. He has continued to wear compression garments and perform HEP at home.   ? ?Pain: ?Are you having pain? Yes ?NPRS scale: 8/10  ?Pain location: Knee ?Pain orientation: Right  ?PAIN TYPE: aching ?Pain description: intermittent  ? ? ?OBJECTIVE:  ?Outcomes: ?TUG: 65" w/ FWW ?30 Second Sit to Stand: 5 reps w/ UE support ? ?LE AROM/PROM: ?   ?A/PROM Right ?01/24/2021 Right ?02/05/2021 Right ?03/05/2021 Right ?03/09/2021 Right ?03/12/2021 Right ?03/16/2021 Right ?03/18/2021  ?Knee flexion 125/127p! 140/145 60 75 83 85 86  ?Knee extension -12/ -8p! '12 20 16 15 10 '$ (post LLLD stretch) 10 (Post LLLD stretch)  ? (Blank rows = not tested) ?  ?LE MMT: ?  ?MMT Right ?01/24/2021 Left ?01/24/2021 Right ?02/19/2021 Right  ?03/05/2021  ?Hip flexion 4+/5 5/5    ?Hip extension 3+/5 4/5    ?Hip abduction 4/5 4/5    ?Knee flexion 4+/5 5/5 5/5 DNT  ?Knee extension 3+/5 with pain 5/5 4/5 with pain DNT  ? (Blank rows = not tested) ?  ?  ?PALPATION: ? TTP, warmth, and stiffness to R calf ? ?TODAY'S TREATMENT: ?[facility] Adult PT Treatment:                                                DATE: 03/18/2021 ?Therapeutic Exercise: ?NuStep lvl 6 UE/LE x 5 min for R knee stretch and gathering subjective info ?Seated hamstring stretch 2x30" R ?Seated AAROM knee flexion utilizing L foot to pull R back, x10 ?Supine quad set with towel under heel 2x10 - 5" hold (noted quad shaking after  first 5 reps) ?SLR (attempted, unable to perform) ?Supine heel slide with stretch 3x10 - 5" hold R, utilizing strap ?Supine LLLD stretch for ext, heel on 1/2 foam roll, 4# above knee x 5 mins ?Manual Therapy (concentrating on increasing extensibility of restricted tissue to reduce discomfort and improve mechanics in functional movement): ?AP mobs to R knee for improving extension Grade II ?Manual flexion/extension stretch ?Modalities: ?Ice to R knee with R LE elevated x 8 min post session ? ? ?[facility] Adult PT Treatment:                                                DATE: 03/16/2021 ?Therapeutic Exercise: ?NuStep lvl 6 UE/LE x 5 min for R knee stretch and gathering subjective info ?Seated hamstring stretch 2x30" R ?Supine quad set with towel under heel 2x10 - 5" hold ?Supine heel slide with stretch 3x10 - 5" hold R, utilizing strap ?Supine LLLD stretch for ext, heel on 1/2 foam roll, 3# above knee x 4 mins ?Supine hamstring  stretch with strap 3x30" hold ?Manual Therapy: ?AP mobs to R knee for improving extension Grade II ?Modalities: ?Ice to R knee with R LE elevated x 8 min post session ? ?[facility] Adult PT Treatment:                                                DATE: 03/12/2021 ?Therapeutic Exercise: ?NuStep lvl 5 UE/LE x 6 min for R knee stretch and gathering subjective info ?Seated heel slide R 3x10 - 5" hold ?Seated hamstring stretch 2x30" R ?Supine quad set with towel under knee x15 - 5" hold ?Supine heel slide with stretch 3x10 - 5" hold R, utilizing strap ?Supine LLLD stretch for ext, heel on 1/2 foam roll, 3# above knee x5 mins ?Manual Therapy: ?AP mobs to R knee for improving extension Grade II ?Modalities: ?Ice to R knee with R LE elevated x 10 min post session ? ? ?PATIENT EDUCATION:  ?Education details: Educated on POC, prognosis, and HEP ?Person educated: Patient ?Education method: Explanation, Demonstration, and Handouts ?Education comprehension: verbalized understanding and returned demonstration ?  ?  ?GAIT: ?Distance walked: 67f ?Assistive device utilized: WEnvironmental consultant- 2 wheeled ?Level of assistance: Modified independence ?Comments: step-to gait with decreased knee extension, slowed gait speed ? ?HOME EXERCISE PROGRAM: ?Access Code: [email or web address]?URL: [email or web address] ?Date: 03/05/2021 ?Prepared by: DOctavio Manns? ?Exercises ?Long Sitting Quad Set with Towel Roll Under Heel - 8 x daily - 7 x weekly - 2-3 sets - 10 reps - 5 sec hold ?Supine Heel Slide with Strap - 8 x daily - 7 x weekly - 2-3 sets - 10 reps - 5-10 sec hold ?Seated Heel Slide - 5 x daily - 7 x weekly - 2-3 sets - 10 reps - 5 sec hold ?Seated Hamstring Stretch - 1 x daily - 7 x weekly - 3 sets - 10 reps ? ?  ?ASSESSMENT: ?  ?CLINICAL IMPRESSION: ?Patient presents to PT with continued severe R knee and calf pain. His knee flexion and extension continue to improve gradually, as noted above. Patient's MD has cleared him of a DVT. Session today  focused on strengthening R quad and improving R knee ROM. His R quad is  weak with noted shaking during quad sets and inability to perform SLR. Patient continues to benefit from skilled PT services and should be progressed as able to improve functional independence. ? ?  ?  ?GOALS: ?Goals reviewed with patient? Yes ?  ? ?LONG TERM GOALS:  ?  ?LTG Name Target Date Goal status  ?1 Pt will self report R knee pain no greater than 2/10 for improved comfort and functional ability ?Baseline: 10/10 at worst 04/30/2021 UPDATED ?INITIAL  ?2 Pt will improve R knee ROM to no less than range of 5-110 degrees post surgery for improving functional mobility ?Baseline: 20-60 degrees 04/30/2021 UPDATED ?INITIAL  ?3 Pt will increase 30 Second Sit to Stand rep count to no less than 10 reps for improved balance, strength, and functional mobility ?Baseline: 5 reps - with UE support 04/30/2021 UPDATED ?INITIAL  ?4 Pt will decrease TUG time to no greater than 20 seconds with LRAD for improved balance and functional mobility ?Baseline: 65 sec with FWW 04/30/2021 UPDATED ?INITIAL  ?  ?PLAN: ?PT FREQUENCY: 2x/week ?  ?PT DURATION: 8 weeks ?  ?PLANNED INTERVENTIONS: Therapeutic exercises, Therapeutic activity, Neuro Muscular re-education, Balance training, Gait training, Patient/Family education, Joint mobilization, Stair training, Aquatic Therapy, Dry Needling, Cryotherapy, Moist heat, Taping, and Manual therapy ? ?Check all possible CPT codes: 97110- Therapeutic Exercise, [phone]- Neuro Re-education, [phone] - Gait Training, [phone] - Manual Therapy, 97530 - Therapeutic Activities, [phone] - Self Care, and [phone] - Vaso   ? ?Evelene Croon, PTA ?03/19/21 12:11 PM ? ? ? ? ?

## 2021-03-19 ENCOUNTER — Ambulatory Visit: Payer: Medicaid Other

## 2021-03-19 ENCOUNTER — Other Ambulatory Visit: Payer: Self-pay

## 2021-03-19 DIAGNOSIS — M25561 Pain in right knee: Secondary | ICD-10-CM | POA: Diagnosis not present

## 2021-03-25 ENCOUNTER — Other Ambulatory Visit: Payer: Self-pay

## 2021-03-25 ENCOUNTER — Ambulatory Visit: Payer: Medicaid Other

## 2021-03-25 DIAGNOSIS — M25561 Pain in right knee: Secondary | ICD-10-CM | POA: Diagnosis not present

## 2021-03-25 DIAGNOSIS — G8929 Other chronic pain: Secondary | ICD-10-CM

## 2021-03-25 DIAGNOSIS — R2689 Other abnormalities of gait and mobility: Secondary | ICD-10-CM

## 2021-03-25 NOTE — Therapy (Signed)
?OUTPATIENT PHYSICAL THERAPY TREATMENT NOTE ? ? ?Patient Name: Matthew Stark ?MRN: [id number] ?DOB:September 27, 1960, 61 y.o., male ?Today's Date: 03/25/2021 ? ?PCP: [facility] ?REFERRING PROVIDER: [facility], [facility] ? ? PT End of Session - 03/25/21 1206   ? ? Visit Number 13   ? Number of Visits 24   ? Date for PT Re-Evaluation 04/30/21   ? Authorization Type [employer]   ? Authorization Time Period 01/28/2021- 03/27/2021   ? Authorization - Visit Number 11   ? Authorization - Number of Visits 16   ? PT Start Time 1210   ? PT Stop Time 6546   ? PT Time Calculation (min) 39 min   ? Activity Tolerance Patient tolerated treatment well   ? Behavior During Therapy [facility] for tasks assessed/performed   ? ?  ?  ? ?  ? ? ? ? ? ? ? ? ? ? ? ?Past Medical History:  ?Diagnosis Date  ? Arthritis   ? Hypertension   ? ?Past Surgical History:  ?Procedure Laterality Date  ? MOUTH SURGERY    ? 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 Procedure: TOTAL KNEE ARTHROPLASTY;  Surgeon: Willaim Sheng, MD;  Location: [facility];  Service: Orthopedics;  Laterality: Right;  ? ?There are no problems to display for this patient. ? ? ?REFERRING DIAG: Post OP right total knee replacement 03/02/21 ? ?THERAPY DIAG:  ?Chronic pain of right knee ? ?Other abnormalities of gait and mobility ? ?PERTINENT HISTORY: Rt knee TKA 03/02/2021 ? ?SUBJECTIVE:  ?Pt presents to PT with continued reports of R knee pain, although it is less at present.He has been compliant with HEP and has increased walking at home. Pt is ready to begin PT at this time. ? ?Pain: ?Are you having pain? Yes ?NPRS scale: 6/10  ?Pain location: Knee ?Pain orientation: Right  ?PAIN TYPE: aching ?Pain description: intermittent  ? ? ?OBJECTIVE:  ?Outcomes: ?TUG: 65" w/ FWW ?30 Second Sit to Stand: 5 reps w/ UE support ? ?LE AROM/PROM: ?  ?A/PROM Right ?01/24/2021 Right ?02/05/2021 Right ?03/05/2021 Right ?03/09/2021 Right ?03/12/2021 Right ?03/16/2021 Right ?03/18/2021  ?Knee flexion 125/127p! 140/145  60 75 83 85 86  ?Knee extension -12/ -8p! '12 20 16 15 10 '$ (post LLLD stretch) 10 (Post LLLD stretch)  ? (Blank rows = not tested) ?  ?LE MMT: ?  ?MMT Right ?01/24/2021 Left ?01/24/2021 Right ?02/19/2021 Right  ?03/05/2021  ?Hip flexion 4+/5 5/5    ?Hip extension 3+/5 4/5    ?Hip abduction 4/5 4/5    ?Knee flexion 4+/5 5/5 5/5 DNT  ?Knee extension 3+/5 with pain 5/5 4/5 with pain DNT  ? (Blank rows = not tested) ?  ?  ?PALPATION: ? TTP, warmth, and stiffness to R calf ? ?TODAY'S TREATMENT: ?[facility] Adult PT Treatment:                                                DATE: 03/25/2021 ?Therapeutic Exercise: ?NuStep lvl 6 UE/LE x 5 min for R knee stretch and gathering subjective info ?Seated hamstring stretch 2x30" R ?Seated AAROM knee flexion utilizing L foot to pull R back, x10 ?Supine quad set with towel under heel 2x10 - 5" hold (noted quad shaking after first 5 reps) ?SLR x 10 with PT assist and cue for quad set ?LAQ x 10 R ?SAQ over  bolster 2x10 R ?Supine heel slide with stretch 3x10 - 5" hold R, utilizing strap ?Supine LLLD stretch for ext, heel on 1/2 foam roll, 4# above knee x 5 mins with ice ?Manual Therapy (concentrating on increasing extensibility of restricted tissue to reduce discomfort and improve mechanics in functional movement): ?AP mobs to R knee for improving extension Grade II ?Manual flexion/extension stretch ? ?[facility] Adult PT Treatment:                                                DATE: 03/18/2021 ?Therapeutic Exercise: ?NuStep lvl 6 UE/LE x 5 min for R knee stretch and gathering subjective info ?Seated hamstring stretch 2x30" R ?Seated AAROM knee flexion utilizing L foot to pull R back, x10 ?Supine quad set with towel under heel 2x10 - 5" hold (noted quad shaking after first 5 reps) ?SLR (attempted, unable to perform) ?Supine heel slide with stretch 3x10 - 5" hold R, utilizing strap ?Supine LLLD stretch for ext, heel on 1/2 foam roll, 4# above knee x 5 mins ?Manual Therapy (concentrating on increasing  extensibility of restricted tissue to reduce discomfort and improve mechanics in functional movement): ?AP mobs to R knee for improving extension Grade II ?Manual flexion/extension stretch ?Modalities: ?Ice to R knee with R LE elevated x 8 min post session ? ? ?[facility] Adult PT Treatment:                                                DATE: 03/16/2021 ?Therapeutic Exercise: ?NuStep lvl 6 UE/LE x 5 min for R knee stretch and gathering subjective info ?Seated hamstring stretch 2x30" R ?Supine quad set with towel under heel 2x10 - 5" hold ?Supine heel slide with stretch 3x10 - 5" hold R, utilizing strap ?Supine LLLD stretch for ext, heel on 1/2 foam roll, 3# above knee x 4 mins ?Supine hamstring stretch with strap 3x30" hold ?Manual Therapy: ?AP mobs to R knee for improving extension Grade II ?Modalities: ?Ice to R knee with R LE elevated x 8 min post session ? ?[facility] Adult PT Treatment:                                                DATE: 03/12/2021 ?Therapeutic Exercise: ?NuStep lvl 5 UE/LE x 6 min for R knee stretch and gathering subjective info ?Seated heel slide R 3x10 - 5" hold ?Seated hamstring stretch 2x30" R ?Supine quad set with towel under knee x15 - 5" hold ?Supine heel slide with stretch 3x10 - 5" hold R, utilizing strap ?Supine LLLD stretch for ext, heel on 1/2 foam roll, 3# above knee x5 mins ?Manual Therapy: ?AP mobs to R knee for improving extension Grade II ?Modalities: ?Ice to R knee with R LE elevated x 10 min post session ? ? ?PATIENT EDUCATION:  ?Education details: Educated on POC, prognosis, and HEP ?Person educated: Patient ?Education method: Explanation, Demonstration, and Handouts ?Education comprehension: verbalized understanding and returned demonstration ?  ?  ?GAIT: ?Distance walked: 52f ?Assistive device utilized: WEnvironmental consultant- 2 wheeled ?Level of assistance: Modified independence ?Comments: step-to gait with decreased  knee extension, slowed gait speed ? ?HOME EXERCISE PROGRAM: ?Access Code:  [email or web address] ?URL: [email or web address] ?Date: 03/05/2021 ?Prepared by: Octavio Manns ? ?Exercises ?Long Sitting Quad Set with Towel Roll Under Heel - 8 x daily - 7 x weekly - 2-3 sets - 10 reps - 5 sec hold ?Supine Heel Slide with Strap - 8 x daily - 7 x weekly - 2-3 sets - 10 reps - 5-10 sec hold ?Seated Heel Slide - 5 x daily - 7 x weekly - 2-3 sets - 10 reps - 5 sec hold ?Seated Hamstring Stretch - 1 x daily - 7 x weekly - 3 sets - 10 reps ? ?  ?ASSESSMENT: ?  ?CLINICAL IMPRESSION: ?Pt was able to complete all prescribed exercises with no adverse effect. He continues to have difficulty with quad activation and relies on L LE to assist R LE with movement. Therapy today continued to focus on quad strengthening and improving R knee flex/ext ROM. Pt continues to require skilled PT services working on improving functional mobility and gait s/p R TKA. Will continue to progress as tolerated per POC.  ?  ?  ?GOALS: ?Goals reviewed with patient? Yes ?  ? ?LONG TERM GOALS:  ?  ?LTG Name Target Date Goal status  ?1 Pt will self report R knee pain no greater than 2/10 for improved comfort and functional ability ?Baseline: 10/10 at worst 04/30/2021 UPDATED ?INITIAL  ?2 Pt will improve R knee ROM to no less than range of 5-110 degrees post surgery for improving functional mobility ?Baseline: 20-60 degrees 04/30/2021 UPDATED ?INITIAL  ?3 Pt will increase 30 Second Sit to Stand rep count to no less than 10 reps for improved balance, strength, and functional mobility ?Baseline: 5 reps - with UE support 04/30/2021 UPDATED ?INITIAL  ?4 Pt will decrease TUG time to no greater than 20 seconds with LRAD for improved balance and functional mobility ?Baseline: 65 sec with FWW 04/30/2021 UPDATED ?INITIAL  ?  ?PLAN: ?PT FREQUENCY: 2x/week ?  ?PT DURATION: 8 weeks ?  ?PLANNED INTERVENTIONS: Therapeutic exercises, Therapeutic activity, Neuro Muscular re-education, Balance training, Gait training, Patient/Family education, Joint  mobilization, Stair training, Aquatic Therapy, Dry Needling, Cryotherapy, Moist heat, Taping, and Manual therapy ? ?Check all possible CPT codes: 97110- Therapeutic Exercise, [phone]- Neuro Re-education, [phone] - Gait Tr

## 2021-03-31 ENCOUNTER — Other Ambulatory Visit: Payer: Self-pay

## 2021-03-31 ENCOUNTER — Ambulatory Visit: Payer: Medicaid Other

## 2021-03-31 DIAGNOSIS — M6281 Muscle weakness (generalized): Secondary | ICD-10-CM

## 2021-03-31 DIAGNOSIS — R2689 Other abnormalities of gait and mobility: Secondary | ICD-10-CM

## 2021-03-31 DIAGNOSIS — M25661 Stiffness of right knee, not elsewhere classified: Secondary | ICD-10-CM

## 2021-03-31 DIAGNOSIS — M25561 Pain in right knee: Secondary | ICD-10-CM | POA: Diagnosis not present

## 2021-03-31 DIAGNOSIS — G8929 Other chronic pain: Secondary | ICD-10-CM

## 2021-03-31 NOTE — Therapy (Signed)
?OUTPATIENT PHYSICAL THERAPY TREATMENT NOTE ? ? ?Patient Name: Matthew Stark ?MRN: [id number] ?DOB:December 05, 1960, 61 y.o., male ?Today's Date: 03/31/2021 ? ?PCP: [facility] ?REFERRING PROVIDER: [facility] ? ? PT End of Session - 03/31/21 1130   ? ? Visit Number 14   ? Number of Visits 24   ? Date for PT Re-Evaluation 04/30/21   ? Authorization Type [employer]   ? Authorization Time Period 01/28/2021- 03/27/2021   ? Authorization - Visit Number 12   ? Authorization - Number of Visits 16   ? PT Start Time 1130   ? PT Stop Time 0093   ? PT Time Calculation (min) 45 min   ? Activity Tolerance Patient tolerated treatment well   ? Behavior During Therapy [facility] for tasks assessed/performed   ? ?  ?  ? ?  ? ? ? ? ? ?Past Medical History:  ?Diagnosis Date  ? Arthritis   ? Hypertension   ? ?Past Surgical History:  ?Procedure Laterality Date  ? MOUTH SURGERY    ? 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 Procedure: TOTAL KNEE ARTHROPLASTY;  Surgeon: Willaim Sheng, MD;  Location: [facility];  Service: Orthopedics;  Laterality: Right;  ? ?There are no problems to display for this patient. ? ? ?REFERRING DIAG: Post OP right total knee replacement 03/02/21 ? ?THERAPY DIAG:  ?Chronic pain of right knee ? ?Other abnormalities of gait and mobility ? ?Muscle weakness (generalized) ? ?Stiffness of right knee, not elsewhere classified ? ?PERTINENT HISTORY: Rt knee TKA 03/02/2021 ? ?SUBJECTIVE: I'm feeling better, my pain is only about a 4. ? ?Pain: ?Are you having pain? Yes ?NPRS scale: 4/10  ?Pain location: Knee ?Pain orientation: Right  ?PAIN TYPE: aching ?Pain description: intermittent  ? ? ?OBJECTIVE:  ?Outcomes: ?TUG: 65" w/ FWW ?30 Second Sit to Stand: 5 reps w/ UE support ? ?LE AROM/PROM: ?  ?A/PROM Right ?01/24/2021 Right ?02/05/2021 Right ?03/05/2021 Right ?03/09/2021 Right ?03/12/2021 Right ?03/16/2021 Right ?03/18/2021 Right ?03/31/2021  ?Knee flexion 125/127p! 140/145 60 75 83 85 86 89  ?Knee extension -12/ -8p! '12 20 16  15 10 '$ (post LLLD stretch) 10 (Post LLLD stretch) 10 (Post LLLD stretch)  ? (Blank rows = not tested) ?  ?LE MMT: ?  ?MMT Right ?01/24/2021 Left ?01/24/2021 Right ?02/19/2021 Right  ?03/05/2021  ?Hip flexion 4+/5 5/5    ?Hip extension 3+/5 4/5    ?Hip abduction 4/5 4/5    ?Knee flexion 4+/5 5/5 5/5 DNT  ?Knee extension 3+/5 with pain 5/5 4/5 with pain DNT  ? (Blank rows = not tested) ?  ?  ?PALPATION: ? TTP, warmth, and stiffness to R calf ? ?TODAY'S TREATMENT: ?[facility] Adult PT Treatment:                                                DATE: 03/31/2021 ?Therapeutic Exercise: ?NuStep lvl 6 UE/LE x 5 min for R knee stretch and gathering subjective info ?Seated hamstring stretch 2x30" R ?Seated AAROM knee flexion utilizing L foot to pull R back, x10 ?Supine quad set with towel under heel 2x10 - 5" hold ?SLR x 10 with PT assist and cue for quad set ?LAQ x 10 R ?SAQ over bolster 2x10 R ?Supine heel slide with stretch 3x10 - 5" hold R, utilizing strap ?Supine LLLD stretch for ext, heel on  1/2 foam roll, 5# above knee x 5 mins  ?Manual Therapy (concentrating on increasing extensibility of restricted tissue to reduce discomfort and improve mechanics in functional movement): ?Manual flexion/extension stretch ?Modalities: ?Ice to R knee with R LE elevated x 8 min post session ? ? ?[facility] Adult PT Treatment:                                                DATE: 03/25/2021 ?Therapeutic Exercise: ?NuStep lvl 6 UE/LE x 5 min for R knee stretch and gathering subjective info ?Seated hamstring stretch 2x30" R ?Seated AAROM knee flexion utilizing L foot to pull R back, x10 ?Supine quad set with towel under heel 2x10 - 5" hold (noted quad shaking after first 5 reps) ?SLR x 10 with PT assist and cue for quad set ?LAQ x 10 R ?SAQ over bolster 2x10 R ?Supine heel slide with stretch 3x10 - 5" hold R, utilizing strap ?Supine LLLD stretch for ext, heel on 1/2 foam roll, 4# above knee x 5 mins with ice ?Manual Therapy (concentrating on increasing  extensibility of restricted tissue to reduce discomfort and improve mechanics in functional movement): ?AP mobs to R knee for improving extension Grade II ?Manual flexion/extension stretch ? ?[facility] Adult PT Treatment:                                                DATE: 03/18/2021 ?Therapeutic Exercise: ?NuStep lvl 6 UE/LE x 5 min for R knee stretch and gathering subjective info ?Seated hamstring stretch 2x30" R ?Seated AAROM knee flexion utilizing L foot to pull R back, x10 ?Supine quad set with towel under heel 2x10 - 5" hold (noted quad shaking after first 5 reps) ?SLR (attempted, unable to perform) ?Supine heel slide with stretch 3x10 - 5" hold R, utilizing strap ?Supine LLLD stretch for ext, heel on 1/2 foam roll, 4# above knee x 5 mins ?Manual Therapy (concentrating on increasing extensibility of restricted tissue to reduce discomfort and improve mechanics in functional movement): ?AP mobs to R knee for improving extension Grade II ?Manual flexion/extension stretch ?Modalities: ?Ice to R knee with R LE elevated x 8 min post session ? ? ?PATIENT EDUCATION:  ?Education details: Educated on POC, prognosis, and HEP ?Person educated: Patient ?Education method: Explanation, Demonstration, and Handouts ?Education comprehension: verbalized understanding and returned demonstration ?  ?  ?GAIT: ?Distance walked: 61f ?Assistive device utilized: WEnvironmental consultant- 2 wheeled ?Level of assistance: Modified independence ?Comments: step-to gait with decreased knee extension, slowed gait speed ? ?HOME EXERCISE PROGRAM: ?Access Code: [email or web address]?URL: [email or web address] ?Date: 03/05/2021 ?Prepared by: DOctavio Manns? ?Exercises ?Long Sitting Quad Set with Towel Roll Under Heel - 8 x daily - 7 x weekly - 2-3 sets - 10 reps - 5 sec hold ?Supine Heel Slide with Strap - 8 x daily - 7 x weekly - 2-3 sets - 10 reps - 5-10 sec hold ?Seated Heel Slide - 5 x daily - 7 x weekly - 2-3 sets - 10 reps - 5 sec hold ?Seated Hamstring  Stretch - 1 x daily - 7 x weekly - 3 sets - 10 reps ? ?  ?ASSESSMENT: ?  ?CLINICAL IMPRESSION: ?Patient presents to PT with mild to moderate levels  of pain and reports HEP compliance. He states he is walking more, but hasn't been bending his knee as much. Encouraged patient to work on heel slides and improving flexion ROM. Session today focused on R LE strengthening and ROM. Patient was able to tolerate all prescribed exercises with no adverse effects. Patient continues to benefit from skilled PT services and should be progressed as able to improve functional independence. ? ?  ?GOALS: ?Goals reviewed with patient? Yes ?  ? ?LONG TERM GOALS:  ?  ?LTG Name Target Date Goal status  ?1 Pt will self report R knee pain no greater than 2/10 for improved comfort and functional ability ?Baseline: 10/10 at worst 04/30/2021 UPDATED ?INITIAL  ?2 Pt will improve R knee ROM to no less than range of 5-110 degrees post surgery for improving functional mobility ?Baseline: 20-60 degrees 04/30/2021 UPDATED ?INITIAL  ?3 Pt will increase 30 Second Sit to Stand rep count to no less than 10 reps for improved balance, strength, and functional mobility ?Baseline: 5 reps - with UE support 04/30/2021 UPDATED ?INITIAL  ?4 Pt will decrease TUG time to no greater than 20 seconds with LRAD for improved balance and functional mobility ?Baseline: 65 sec with FWW 04/30/2021 UPDATED ?INITIAL  ?  ?PLAN: ?PT FREQUENCY: 2x/week ?  ?PT DURATION: 8 weeks ?  ?PLANNED INTERVENTIONS: Therapeutic exercises, Therapeutic activity, Neuro Muscular re-education, Balance training, Gait training, Patient/Family education, Joint mobilization, Stair training, Aquatic Therapy, Dry Needling, Cryotherapy, Moist heat, Taping, and Manual therapy ? ?Check all possible CPT codes: 97026- Therapeutic Exercise, [phone]- Neuro Re-education, [phone] - Gait Training, [phone] - Manual Therapy, 97530 - Therapeutic Activities, [phone] - Self Care, and [phone] - Vaso   ? ? ?Evelene Croon,  PTA ?03/31/21 11:34 AM ? ? ? ?

## 2021-04-02 ENCOUNTER — Ambulatory Visit: Payer: Medicaid Other

## 2021-04-02 DIAGNOSIS — M25561 Pain in right knee: Secondary | ICD-10-CM | POA: Diagnosis not present

## 2021-04-02 DIAGNOSIS — M6281 Muscle weakness (generalized): Secondary | ICD-10-CM

## 2021-04-02 DIAGNOSIS — M25661 Stiffness of right knee, not elsewhere classified: Secondary | ICD-10-CM

## 2021-04-02 DIAGNOSIS — G8929 Other chronic pain: Secondary | ICD-10-CM

## 2021-04-02 DIAGNOSIS — R2689 Other abnormalities of gait and mobility: Secondary | ICD-10-CM

## 2021-04-02 NOTE — Therapy (Signed)
?OUTPATIENT PHYSICAL THERAPY TREATMENT NOTE ? ? ?Patient Name: Matthew Stark ?MRN: [id number] ?DOB:02-28-1960, 61 y.o., male ?Today's Date: 04/02/2021 ? ?PCP: [facility] ?REFERRING PROVIDER: [facility] ? ? PT End of Session - 04/02/21 1212   ? ? Visit Number 15   ? Number of Visits 24   ? Date for PT Re-Evaluation 04/30/21   ? Authorization Type [employer]   ? Authorization Time Period 01/28/2021- 03/27/2021   ? Authorization - Visit Number 13   ? Authorization - Number of Visits 16   ? PT Start Time 1212   ? PT Stop Time 2706   ? PT Time Calculation (min) 42 min   ? Activity Tolerance Patient tolerated treatment well   ? Behavior During Therapy [facility] for tasks assessed/performed   ? ?  ?  ? ?  ? ? ? ? ? ? ?Past Medical History:  ?Diagnosis Date  ? Arthritis   ? Hypertension   ? ?Past Surgical History:  ?Procedure Laterality Date  ? MOUTH SURGERY    ? 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 Procedure: TOTAL KNEE ARTHROPLASTY;  Surgeon: Willaim Sheng, MD;  Location: [facility];  Service: Orthopedics;  Laterality: Right;  ? ?There are no problems to display for this patient. ? ? ?REFERRING DIAG: Post OP right total knee replacement 03/02/21 ? ?THERAPY DIAG:  ?Chronic pain of right knee ? ?Other abnormalities of gait and mobility ? ?Muscle weakness (generalized) ? ?Stiffness of right knee, not elsewhere classified ? ?PERTINENT HISTORY: Rt knee TKA 03/02/2021 ? ?SUBJECTIVE: Patient reports he felt ok after last session. ? ?Pain: ?Are you having pain? Yes ?NPRS scale: 3/10  ?Pain location: Knee ?Pain orientation: Right  ?PAIN TYPE: aching ?Pain description: intermittent  ? ? ?OBJECTIVE:  ?Outcomes: ?TUG: 65" w/ FWW ?30 Second Sit to Stand: 5 reps w/ UE support ? ?LE AROM/PROM: ?  ?A/PROM Right ?01/24/2021 Right ?02/05/2021 Right ?03/05/2021 Right ?03/09/2021 Right ?03/12/2021 Right ?03/16/2021 Right ?03/18/2021 Right ?03/31/2021 Right ?04/02/2021  ?Knee flexion 125/127p! 140/145 60 75 83 85 86 89 90  ?Knee  extension -12/ -8p! '12 20 16 15 10 '$ (post LLLD stretch) 10 (Post LLLD stretch) 10 (Post LLLD stretch) 9 (post LLLD stretch)   ? (Blank rows = not tested) ?  ?LE MMT: ?  ?MMT Right ?01/24/2021 Left ?01/24/2021 Right ?02/19/2021 Right  ?03/05/2021  ?Hip flexion 4+/5 5/5    ?Hip extension 3+/5 4/5    ?Hip abduction 4/5 4/5    ?Knee flexion 4+/5 5/5 5/5 DNT  ?Knee extension 3+/5 with pain 5/5 4/5 with pain DNT  ? (Blank rows = not tested) ?  ?  ?PALPATION: ? TTP, warmth, and stiffness to R calf ? ?TODAY'S TREATMENT: ?[facility] Adult PT Treatment:                                                DATE: 04/02/2021 ?Therapeutic Exercise: ?NuStep lvl 6 UE/LE x 5 min for R knee stretch and gathering subjective info ?Standing TKE against ball on wall 5" hold 2x10 ?Seated hamstring stretch 2x30" R ?Supine quad set with towel under heel 2x10 - 5" hold ?SAQ over bolster 3x10 R ?Supine heel slide with stretch 2x10 - 5" hold R, utilizing strap ?Supine LLLD stretch for ext, heel on 1/2 foam roll, 6# above knee x 5 mins  ?Manual  Therapy (concentrating on increasing extensibility of restricted tissue to reduce discomfort and improve mechanics in functional movement): ?Manual flexion/extension stretch ?AP mobs to R knee for improving extension Grade II ?Modalities: ?Ice to R knee with R LE elevated x 8 min post session ? ? ?[facility] Adult PT Treatment:                                                DATE: 03/31/2021 ?Therapeutic Exercise: ?NuStep lvl 6 UE/LE x 5 min for R knee stretch and gathering subjective info ?Seated hamstring stretch 2x30" R ?Seated AAROM knee flexion utilizing L foot to pull R back, x10 ?Supine quad set with towel under heel 2x10 - 5" hold ?SLR x 10 with PT assist and cue for quad set ?LAQ x 10 R ?SAQ over bolster 2x10 R ?Supine heel slide with stretch 3x10 - 5" hold R, utilizing strap ?Supine LLLD stretch for ext, heel on 1/2 foam roll, 5# above knee x 5 mins  ?Manual Therapy (concentrating on increasing extensibility of  restricted tissue to reduce discomfort and improve mechanics in functional movement): ?Manual flexion/extension stretch ?Modalities: ?Ice to R knee with R LE elevated x 8 min post session ? ? ?[facility] Adult PT Treatment:                                                DATE: 03/25/2021 ?Therapeutic Exercise: ?NuStep lvl 6 UE/LE x 5 min for R knee stretch and gathering subjective info ?Seated hamstring stretch 2x30" R ?Seated AAROM knee flexion utilizing L foot to pull R back, x10 ?Supine quad set with towel under heel 2x10 - 5" hold (noted quad shaking after first 5 reps) ?SLR x 10 with PT assist and cue for quad set ?LAQ x 10 R ?SAQ over bolster 2x10 R ?Supine heel slide with stretch 3x10 - 5" hold R, utilizing strap ?Supine LLLD stretch for ext, heel on 1/2 foam roll, 4# above knee x 5 mins with ice ?Manual Therapy (concentrating on increasing extensibility of restricted tissue to reduce discomfort and improve mechanics in functional movement): ?AP mobs to R knee for improving extension Grade II ?Manual flexion/extension stretch ? ? ?PATIENT EDUCATION:  ?Education details: Educated on POC, prognosis, and HEP ?Person educated: Patient ?Education method: Explanation, Demonstration, and Handouts ?Education comprehension: verbalized understanding and returned demonstration ?  ?  ?GAIT: ?Distance walked: 5f ?Assistive device utilized: WEnvironmental consultant- 2 wheeled ?Level of assistance: Modified independence ?Comments: step-to gait with decreased knee extension, slowed gait speed ? ?HOME EXERCISE PROGRAM: ?Access Code: [email or web address]?URL: [email or web address] ?Date: 04/02/2021 ?Prepared by: SEvelene Croon? ?Exercises ?- Long Sitting Quad Set with Towel Roll Under Heel  - 8 x daily - 7 x weekly - 2-3 sets - 10 reps - 5 sec hold ?- Supine Heel Slide with Strap  - 8 x daily - 7 x weekly - 2-3 sets - 10 reps - 5-10 sec hold ?- Seated Heel Slide  - 5 x daily - 7 x weekly - 2-3 sets - 10 reps - 5 sec hold ?- Seated Hamstring  Stretch  - 1 x daily - 7 x weekly - 3 sets - 10 reps ?Added 04/02/2021 - Standing Terminal Knee Extension at [place]  [place]  - 1 x daily - 7 x weekly - 3 sets - 10 reps ? ?  ?ASSESSMENT: ?  ?CLINICAL IMPRESSION: ?Patient reports to PT with mild pain in his R knee and reports HEP compliance. Session today focused on R LE strengthening and ROM, particularly emphasizing extension. Added TKE to HEP to improve extension. Noticeable fatigue in quads at end of session with frequent muscle shaking. Patient was able to tolerate all prescribed exercises with no adverse effects. Patient continues to benefit from skilled PT services and should be progressed as able to improve functional independence. ? ?  ?GOALS: ?Goals reviewed with patient? Yes ?  ? ?LONG TERM GOALS:  ?  ?LTG Name Target Date Goal status  ?1 Pt will self report R knee pain no greater than 2/10 for improved comfort and functional ability ?Baseline: 10/10 at worst 04/30/2021 UPDATED ?INITIAL  ?2 Pt will improve R knee ROM to no less than range of 5-110 degrees post surgery for improving functional mobility ?Baseline: 20-60 degrees 04/30/2021 UPDATED ?INITIAL  ?3 Pt will increase 30 Second Sit to Stand rep count to no less than 10 reps for improved balance, strength, and functional mobility ?Baseline: 5 reps - with UE support 04/30/2021 UPDATED ?INITIAL  ?4 Pt will decrease TUG time to no greater than 20 seconds with LRAD for improved balance and functional mobility ?Baseline: 65 sec with FWW 04/30/2021 UPDATED ?INITIAL  ?  ?PLAN: ?PT FREQUENCY: 2x/week ?  ?PT DURATION: 8 weeks ?  ?PLANNED INTERVENTIONS: Therapeutic exercises, Therapeutic activity, Neuro Muscular re-education, Balance training, Gait training, Patient/Family education, Joint mobilization, Stair training, Aquatic Therapy, Dry Needling, Cryotherapy, Moist heat, Taping, and Manual therapy ? ?Check all possible CPT codes: 73710- Therapeutic Exercise, [phone]- Neuro Re-education, [phone] - Gait Training, [phone]  - Manual Therapy, 97530 - Therapeutic Activities, [phone] - Self Care, and [phone] - Vaso   ? ? ?Evelene Croon, PTA ?04/02/21 12:55 PM ? ? ? ?

## 2021-04-07 ENCOUNTER — Ambulatory Visit: Payer: Medicaid Other | Attending: Orthopedic Surgery

## 2021-04-07 DIAGNOSIS — M25561 Pain in right knee: Secondary | ICD-10-CM | POA: Insufficient documentation

## 2021-04-07 DIAGNOSIS — G8929 Other chronic pain: Secondary | ICD-10-CM | POA: Insufficient documentation

## 2021-04-07 DIAGNOSIS — M6281 Muscle weakness (generalized): Secondary | ICD-10-CM | POA: Diagnosis present

## 2021-04-07 DIAGNOSIS — R2689 Other abnormalities of gait and mobility: Secondary | ICD-10-CM | POA: Diagnosis present

## 2021-04-07 DIAGNOSIS — M25661 Stiffness of right knee, not elsewhere classified: Secondary | ICD-10-CM | POA: Diagnosis present

## 2021-04-07 NOTE — Therapy (Signed)
?OUTPATIENT PHYSICAL THERAPY TREATMENT NOTE ? ? ?Patient Name: Matthew Stark ?MRN: [id number] ?DOB:1960/06/24, 61 y.o., male ?Today's Date: 04/07/2021 ? ?PCP: [facility] ?REFERRING PROVIDER: [facility] ? ? PT End of Session - 04/07/21 1059   ? ? Visit Number 16   ? Number of Visits 24   ? Date for PT Re-Evaluation 04/30/21   ? Authorization Type [employer]   ? Authorization Time Period 01/28/2021- 03/27/2021   ? Authorization - Visit Number 14   ? Authorization - Number of Visits 16   ? PT Start Time 1130   ? PT Stop Time 1208   ? PT Time Calculation (min) 38 min   ? Activity Tolerance Patient tolerated treatment well   ? Behavior During Therapy [facility] for tasks assessed/performed   ? ?  ?  ? ?  ? ? ? ? ? ? ? ?Past Medical History:  ?Diagnosis Date  ? Arthritis   ? Hypertension   ? ?Past Surgical History:  ?Procedure Laterality Date  ? MOUTH SURGERY    ? 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 Procedure: TOTAL KNEE ARTHROPLASTY;  Surgeon: Willaim Sheng, MD;  Location: [facility];  Service: Orthopedics;  Laterality: Right;  ? ?There are no problems to display for this patient. ? ? ?REFERRING DIAG: Post OP right total knee replacement 03/02/21 ? ?THERAPY DIAG:  ?Chronic pain of right knee ? ?Other abnormalities of gait and mobility ? ?PERTINENT HISTORY: Rt knee TKA 03/02/2021 ? ?SUBJECTIVE:  ?Pt presents to PT with continued reports of R knee pain. Hd f/u visit with MD office the previous day, notes that the physician said he is behind in ROM. Pt is ready to begin PT a this time.  ? ?Pain: ?Are you having pain? Yes ?NPRS scale: 7/10  ?Pain location: Knee ?Pain orientation: Right  ?PAIN TYPE: aching ?Pain description: intermittent  ? ? ?OBJECTIVE:  ?Outcomes: ?TUG: 65" w/ FWW ?30 Second Sit to Stand: 5 reps w/ UE support ? ?LE AROM/PROM: ?  ?A/PROM Right ?01/24/2021 Right ?02/05/2021 Right ?03/05/2021 Right ?03/09/2021 Right ?03/12/2021 Right ?03/16/2021 Right ?03/18/2021 Right ?03/31/2021 Right ?04/02/2021  Right ?04/07/2021  ?Knee flexion 125/127p! 140/145 60 75 83 85 86 89 90 95 AAROM  ?Knee extension -12/ -8p! '12 20 16 15 10 '$ (post LLLD stretch) 10 (Post LLLD stretch) 10 (Post LLLD stretch) 9 (post LLLD stretch)  9 post LLLD stretch and manual  ? (Blank rows = not tested) ?  ?LE MMT: ?  ?MMT Right ?01/24/2021 Left ?01/24/2021 Right ?02/19/2021 Right  ?03/05/2021  ?Hip flexion 4+/5 5/5    ?Hip extension 3+/5 4/5    ?Hip abduction 4/5 4/5    ?Knee flexion 4+/5 5/5 5/5 DNT  ?Knee extension 3+/5 with pain 5/5 4/5 with pain DNT  ? (Blank rows = not tested) ?  ?  ?TODAY'S TREATMENT: ?[facility] Adult PT Treatment:                                                DATE: 04/07/2021 ?Therapeutic Exercise: ?NuStep lvl 6 UE/LE x 5 min for R knee stretch and gathering subjective info ?Standing TKE against ball on wall 5" hold 2x10 ?LAQ 2x15 R ?Supine heel slide with strap 2x10 - 5" hold ?Supine quad set with half foam under heel 2x10 - 5" hold ?Supine LLLD stretch for ext,  heel on 1/2 foam roll, 6# above knee x 4 mins  ?Supine hamstring stretch with strap 2x45" R ?Manual Therapy (concentrating on increasing extensibility of restricted tissue to reduce discomfort and improve mechanics in functional movement): ?Manual flexion stretch sitting edge of mat ?AP mobs to R knee for improving extension Grade II ?Gait Training ?Amb with SPC in L hand CGA x 149f with PT VC for step-to pattern and sequencing ?Modalities: ?Ice to R knee with R LE elevated x 8 min post session ? [facility]                                                [facility] ?Therapeutic Exercise: ?NuStep lvl 6 UE/LE x 5 min for R knee stretch and gathering subjective info ?Standing TKE against ball on wall 5" hold 2x10 ?Seated hamstring stretch 2x30" R ?Supine quad set with towel under heel 2x10 - 5" hold ?SAQ over bolster 3x10 R ?Supine heel slide with stretch 2x10 - 5" hold R, utilizing strap ?Supine LLLD stretch for ext, heel on 1/2 foam roll, 6# above knee x 5  mins  ?Manual Therapy (concentrating on increasing extensibility of restricted tissue to reduce discomfort and improve mechanics in functional movement): ?Manual flexion/extension stretch ?AP mobs to R knee for improving extension Grade II ?Modalities: ?Ice to R knee with R LE elevated x 8 min post session ? ? [facility]                                                [facility]: [facility] ?Therapeutic Exercise: ?NuStep lvl 6 UE/LE x 5 min for R knee stretch and gathering subjective info ?Seated hamstring stretch 2x30" R ?Seated AAROM knee flexion utilizing L foot to pull R back, x10 ?Supine quad set with towel under heel 2x10 - 5" hold ?SLR x 10 with PT assist and cue for quad set ?LAQ x 10 R ?SAQ over bolster 2x10 R ?Supine heel slide with stretch 3x10 - 5" hold R, utilizing strap ?Supine LLLD stretch for ext, heel on 1/2 foam roll, 5# above knee x 5 mins  ?Manual Therapy (concentrating on increasing extensibility of restricted tissue to reduce discomfort and improve mechanics in functional movement): ?Manual flexion/extension stretch ?Modalities: ?Ice to R knee with R LE elevated x 8 min post session ? ? ?PATIENT EDUCATION:  ?Education details: Educated on POC, prognosis, and HEP ?Person educated: Patient ?Education method: Explanation, Demonstration, and Handouts ?Education comprehension: verbalized understanding and returned demonstration ?  ?  ?GAIT: ?Distance walked: 248f?Assistive device utilized: WaEnvironmental consultant 2 wheeled ?Level of assistance: Modified independence ?Comments: step-to gait with decreased knee extension, slowed gait speed ? ?HOME EXERCISE PROGRAM: ?Access Code: [email or web address]: [email or web address] ?Date: 04/02/2021 ?Prepared by: StEvelene Croon ?Exercises ?- Long Sitting Quad Set with Towel Roll Under Heel  - 8 x daily - 7 x weekly - 2-3 sets - 10 reps - 5 sec hold ?- Supine Heel Slide with Strap  - 8 x daily - 7 x weekly - 2-3 sets - 10 reps - 5-10 sec hold ?- Seated Heel  Slide  - 5 x daily - 7 x weekly - 2-3 sets - 10 reps - 5 sec  hold ?- Seated Hamstring Stretch  - 1 x daily - 7 x weekly - 3 sets - 10 reps ?Added 04/02/2021 - Standing Terminal Knee Extension at Wall with Ball  - 1 x daily - 7 x weekly - 3 sets - 10 reps ? ?  ?ASSESSMENT: ?  ?CLINICAL IMPRESSION: ?Pt was able to complete all prescribed exercises with no adverse effect. Therapy today introduced walking with SPC which pt did well with step-to pattern. Pt demonstrated increase in flexion, but continues to be stuck at roughly the same extension. PT continued to educate pt to work on improving extension and walking with SPC. Pt continues to benefit from skilled PT services and will continue to be seen and progressed as able.  ? ?  ?GOALS: ?Goals reviewed with patient? Yes ?  ? ?LONG TERM GOALS:  ?  ?LTG Name Target Date Goal status  ?1 Pt will self report R knee pain no greater than 2/10 for improved comfort and functional ability ?Baseline: 10/10 at worst 04/30/2021 UPDATED ?INITIAL  ?2 Pt will improve R knee ROM to no less than range of 5-110 degrees post surgery for improving functional mobility ?Baseline: 20-60 degrees 04/30/2021 UPDATED ?INITIAL  ?3 Pt will increase 30 Second Sit to Stand rep count to no less than 10 reps for improved balance, strength, and functional mobility ?Baseline: 5 reps - with UE support 04/30/2021 UPDATED ?INITIAL  ?4 Pt will decrease TUG time to no greater than 20 seconds with LRAD for improved balance and functional mobility ?Baseline: 65 sec with FWW 04/30/2021 UPDATED ?INITIAL  ?  ?PLAN: ?PT FREQUENCY: 2x/week ?  ?PT DURATION: 8 weeks ?  ?PLANNED INTERVENTIONS: Therapeutic exercises, Therapeutic activity, Neuro Muscular re-education, Balance training, Gait training, Patient/Family education, Joint mobilization, Stair training, Aquatic Therapy, Dry Needling, Cryotherapy, Moist heat, Taping, and Manual therapy ? ?Check all possible CPT codes: 97110- Therapeutic Exercise, [phone]- Neuro  Re-education, [phone] - Gait Training, [phone] - Manual Therapy, 97530 - Therapeutic Activities, [phone] - Self Care, and [phone] - Vaso   ? ?Ward Chatters PT ?04/07/21 12:11 PM ? ? ? ?

## 2021-04-09 ENCOUNTER — Ambulatory Visit: Payer: Medicaid Other

## 2021-04-09 DIAGNOSIS — M6281 Muscle weakness (generalized): Secondary | ICD-10-CM

## 2021-04-09 DIAGNOSIS — R2689 Other abnormalities of gait and mobility: Secondary | ICD-10-CM

## 2021-04-09 DIAGNOSIS — G8929 Other chronic pain: Secondary | ICD-10-CM

## 2021-04-09 DIAGNOSIS — M25561 Pain in right knee: Secondary | ICD-10-CM | POA: Diagnosis not present

## 2021-04-09 DIAGNOSIS — M25661 Stiffness of right knee, not elsewhere classified: Secondary | ICD-10-CM

## 2021-04-09 NOTE — Therapy (Signed)
?OUTPATIENT PHYSICAL THERAPY TREATMENT NOTE ? ? ?Patient Name: Matthew Stark ?MRN: [id number] ?DOB:Mar 19, 1960, 61 y.o., male ?Today's Date: 04/09/2021 ? ?PCP: [facility] ?REFERRING PROVIDER: Willaim Sheng, MD ? ? PT End of Session - 04/09/21 1125   ? ? Visit Number 17   ? Date for PT Re-Evaluation 04/30/21   ? Authorization Type [employer]   ? Authorization Time Period 8 more visits 03/09/2021-04/10/2021   ? Authorization - Visit Number 15   ? Authorization - Number of Visits 16   ? PT Start Time 1126   ? PT Stop Time 1209   ? PT Time Calculation (min) 43 min   ? Activity Tolerance Patient tolerated treatment well   ? Behavior During Therapy [facility] for tasks assessed/performed   ? ?  ?  ? ?  ? ? ? ? ? ? ? ? ?Past Medical History:  ?Diagnosis Date  ? Arthritis   ? Hypertension   ? ?Past Surgical History:  ?Procedure Laterality Date  ? MOUTH SURGERY    ? 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 Procedure: TOTAL KNEE ARTHROPLASTY;  Surgeon: Willaim Sheng, MD;  Location: [facility];  Service: Orthopedics;  Laterality: Right;  ? ?There are no problems to display for this patient. ? ? ?REFERRING DIAG: Post OP right total knee replacement 03/02/21 ? ?THERAPY DIAG:  ?Chronic pain of right knee ? ?Other abnormalities of gait and mobility ? ?Muscle weakness (generalized) ? ?Stiffness of right knee, not elsewhere classified ? ?PERTINENT HISTORY: Rt knee TKA 03/02/2021 ? ?SUBJECTIVE: Patient presents to PT ambulating with [facility] and reports continued knee pain. ? ?Pain: ?Are you having pain? Yes ?NPRS scale: 5/10  ?Pain location: Knee ?Pain orientation: Right  ?PAIN TYPE: aching ?Pain description: intermittent  ? ? ?OBJECTIVE:  ?Outcomes: ?TUG: 65" w/ FWW ?30 Second Sit to Stand: 5 reps w/ UE support ? ?LE AROM/PROM: ?  ?A/PROM Right ?01/24/2021 Right ?02/05/2021 Right ?03/05/2021 Right ?03/09/2021 Right ?03/12/2021 Right ?03/16/2021 Right ?03/18/2021 Right ?03/31/2021 Right ?04/02/2021 Right ?04/07/2021 Right ?04/09/2021  ?Knee  flexion 125/127p! 140/145 60 75 83 85 86 89 90 95 AAROM 100 ?AROM  ?Knee extension -12/ -8p! '12 20 16 15 10 '$ (post LLLD stretch) 10 (Post LLLD stretch) 10 (Post LLLD stretch) 9 (post LLLD stretch)  9 post LLLD stretch and manual 8 (post LLLD stretch and manual)  ? (Blank rows = not tested) ?  ?LE MMT: ?  ?MMT Right ?01/24/2021 Left ?01/24/2021 Right ?02/19/2021 Right  ?03/05/2021  ?Hip flexion 4+/5 5/5    ?Hip extension 3+/5 4/5    ?Hip abduction 4/5 4/5    ?Knee flexion 4+/5 5/5 5/5 DNT  ?Knee extension 3+/5 with pain 5/5 4/5 with pain DNT  ? (Blank rows = not tested) ?  ?  ?TODAY'S TREATMENT: ?[facility] Adult PT Treatment:                                                DATE: 04/09/2021 ?Therapeutic Exercise: ?NuStep lvl 6 UE/LE x 6 min for R knee stretch and gathering subjective info ?Standing TKE against ball on wall 5" hold 2x10 ?LAQ 2x15 R ?Supine heel slide with strap 2x10 - 5" hold ?Supine quad set with half foam under heel 2x10 - 5" hold ?Supine LLLD stretch for ext, heel on 1/2 foam roll, 6# above knee x 4  mins  ?Supine hamstring stretch with strap 2x45" R ?Manual Therapy (concentrating on increasing extensibility of restricted tissue to reduce discomfort and improve mechanics in functional movement): ?Manual flexion stretch in supine, leg hanging off EOM ?AP mobs to R knee for improving extension Grade II ?Modalities: ?Ice to R knee with R LE elevated x 8 min post session ? ? ?[facility] Adult PT Treatment:                                                DATE: 04/07/2021 ?Therapeutic Exercise: ?NuStep lvl 6 UE/LE x 5 min for R knee stretch and gathering subjective info ?Standing TKE against ball on wall 5" hold 2x10 ?LAQ 2x15 R ?Supine heel slide with strap 2x10 - 5" hold ?Supine quad set with half foam under heel 2x10 - 5" hold ?Supine LLLD stretch for ext, heel on 1/2 foam roll, 6# above knee x 4 mins  ?Supine hamstring stretch with strap 2x45" R ?Manual Therapy (concentrating on increasing extensibility of restricted tissue  to reduce discomfort and improve mechanics in functional movement): ?Manual flexion stretch sitting edge of mat ?AP mobs to R knee for improving extension Grade II ?Gait Training ?Amb with SPC in L hand CGA x 16f with PT VC for step-to pattern and sequencing ?Modalities: ?Ice to R knee with R LE elevated x 8 min post session ? [facility]                                                [facility]: [facility] ?Therapeutic Exercise: ?NuStep lvl 6 UE/LE x 5 min for R knee stretch and gathering subjective info ?Standing TKE against ball on wall 5" hold 2x10 ?Seated hamstring stretch 2x30" R ?Supine quad set with towel under heel 2x10 - 5" hold ?SAQ over bolster 3x10 R ?Supine heel slide with stretch 2x10 - 5" hold R, utilizing strap ?Supine LLLD stretch for ext, heel on 1/2 foam roll, 6# above knee x 5 mins  ?Manual Therapy (concentrating on increasing extensibility of restricted tissue to reduce discomfort and improve mechanics in functional movement): ?Manual flexion/extension stretch ?AP mobs to R knee for improving extension Grade II ?Modalities: ?Ice to R knee with R LE elevated x 8 min post session ? ? ? ?PATIENT EDUCATION:  ?Education details: Educated on POC, prognosis, and HEP ?Person educated: Patient ?Education method: Explanation, Demonstration, and Handouts ?Education comprehension: verbalized understanding and returned demonstration ?  ?  ?GAIT: ?Distance walked: 251f?Assistive device utilized: WaEnvironmental consultant 2 wheeled ?Level of assistance: Modified independence ?Comments: step-to gait with decreased knee extension, slowed gait speed ? ?HOME EXERCISE PROGRAM: ?Access Code: [email or web address]: [email or web address] ?Date: 04/02/2021 ?Prepared by: StEvelene Croon ?Exercises ?- Long Sitting Quad Set with Towel Roll Under Heel  - 8 x daily - 7 x weekly - 2-3 sets - 10 reps - 5 sec hold ?- Supine Heel Slide with Strap  - 8 x daily - 7 x weekly - 2-3 sets - 10 reps - 5-10 sec hold ?- Seated Heel  Slide  - 5 x daily - 7 x weekly - 2-3 sets - 10 reps - 5 sec hold ?- Seated Hamstring Stretch  - 1 x daily - 7 x  weekly - 3 sets - 10 reps ?Added 04/02/2021 - Standing Terminal Knee Extension at Wall with Ball  - 1 x daily - 7 x weekly - 3 sets - 10 reps ? ?  ?ASSESSMENT: ?  ?CLINICAL IMPRESSION: ?Patient presents to PT ambulating with [facility] and reports he is only using his RW for long distances. He reports continued pain in his R knee and reports HEP compliance. Session today focused on R quad strengthen and ROM. His flexion ROM has improved this session, and his extension improved minimally. Patient was able to tolerate all prescribed exercises with no adverse effects. Patient continues to benefit from skilled PT services to improve extension, gait with SPC, and RLE strength, and should be progressed as able to improve functional independence. ? ?  ?GOALS: ?Goals reviewed with patient? Yes ?  ? ?LONG TERM GOALS:  ?  ?LTG Name Target Date Goal status  ?1 Pt will self report R knee pain no greater than 2/10 for improved comfort and functional ability ?Baseline: 10/10 at worst 04/30/2021 UPDATED ?INITIAL  ?2 Pt will improve R knee ROM to no less than range of 5-110 degrees post surgery for improving functional mobility ?Baseline: 20-60 degrees 04/30/2021 UPDATED ?INITIAL  ?3 Pt will increase 30 Second Sit to Stand rep count to no less than 10 reps for improved balance, strength, and functional mobility ?Baseline: 5 reps - with UE support 04/30/2021 UPDATED ?INITIAL  ?4 Pt will decrease TUG time to no greater than 20 seconds with LRAD for improved balance and functional mobility ?Baseline: 65 sec with FWW 04/30/2021 UPDATED ?INITIAL  ?  ?PLAN: ?PT FREQUENCY: 2x/week ?  ?PT DURATION: 8 weeks ?  ?PLANNED INTERVENTIONS: Therapeutic exercises, Therapeutic activity, Neuro Muscular re-education, Balance training, Gait training, Patient/Family education, Joint mobilization, Stair training, Aquatic Therapy, Dry Needling,  Cryotherapy, Moist heat, Taping, and Manual therapy ? ?Check all possible CPT codes: 97110- Therapeutic Exercise, [phone]- Neuro Re-education, [phone] - Gait Training, [phone] - Manual Therapy, [phone] - Therapeutic Activiti

## 2021-04-14 ENCOUNTER — Ambulatory Visit: Payer: Medicaid Other

## 2021-04-14 DIAGNOSIS — M25561 Pain in right knee: Secondary | ICD-10-CM | POA: Diagnosis not present

## 2021-04-14 DIAGNOSIS — R2689 Other abnormalities of gait and mobility: Secondary | ICD-10-CM

## 2021-04-14 DIAGNOSIS — G8929 Other chronic pain: Secondary | ICD-10-CM

## 2021-04-14 DIAGNOSIS — M25661 Stiffness of right knee, not elsewhere classified: Secondary | ICD-10-CM

## 2021-04-14 DIAGNOSIS — M6281 Muscle weakness (generalized): Secondary | ICD-10-CM

## 2021-04-14 NOTE — Therapy (Signed)
?OUTPATIENT PHYSICAL THERAPY TREATMENT NOTE ? ? ?Patient Name: Matthew Stark ?MRN: [id number] ?DOB:Oct 20, 1960, 61 y.o., male ?Today's Date: 04/14/2021 ? ?PCP: [facility] ?REFERRING PROVIDER: [facility], [facility] ? ? PT End of Session - 04/14/21 1127   ? ? Visit Number 18   ? Number of Visits 24   ? Date for PT Re-Evaluation 04/30/21   ? Authorization Type [employer]   ? Authorization Time Period 8 more visits 03/09/2021-04/10/2021   ? Authorization - Visit Number 16   ? Authorization - Number of Visits 16   ? PT Start Time 1127   ? PT Stop Time 1210   ? PT Time Calculation (min) 43 min   ? Activity Tolerance Patient tolerated treatment well   ? Behavior During Therapy [facility] for tasks assessed/performed   ? ?  ?  ? ?  ? ? ? ? ? ? ? ? ? ?Past Medical History:  ?Diagnosis Date  ? Arthritis   ? Hypertension   ? ?Past Surgical History:  ?Procedure Laterality Date  ? MOUTH SURGERY    ? 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 Procedure: TOTAL KNEE ARTHROPLASTY;  Surgeon: Willaim Sheng, MD;  Location: [facility];  Service: Orthopedics;  Laterality: Right;  ? ?There are no problems to display for this patient. ? ? ?REFERRING DIAG: Post OP right total knee replacement 03/02/21 ? ?THERAPY DIAG:  ?Chronic pain of right knee ? ?Other abnormalities of gait and mobility ? ?Muscle weakness (generalized) ? ?Stiffness of right knee, not elsewhere classified ? ?PERTINENT HISTORY: Rt knee TKA 03/02/2021 ? ?SUBJECTIVE: Patient presents to PT ambulating with [facility] and reports continued knee pain, though lessened overall. ? ?Pain: ?Are you having pain? Yes ?NPRS scale: 2/10  ?Pain location: Knee ?Pain orientation: Right  ?PAIN TYPE: aching ?Pain description: intermittent  ? ? ?OBJECTIVE:  ?Outcomes: ?TUG: 65" w/ FWW ?30 Second Sit to Stand: 5 reps w/ UE support ?04/14/2021: 30" STS: 11 reps no UE support ? ?LE AROM/PROM: ?  ?A/PROM Right ?01/24/2021 Right ?02/05/2021 Right ?03/05/2021 Right ?03/09/2021 Right ?03/12/2021 Right ?03/16/2021  Right ?03/18/2021 Right ?03/31/2021 Right ?04/02/2021 Right ?04/07/2021 Right ?04/09/2021 Right ?04/14/2021  ?Knee flexion 125/127p! 140/145 60 75 83 85 86 89 90 95 AAROM 100 ?AROM 102 AAROM  ?Knee extension -12/ -8p! _0 (post LLLD stretch) 10 (Post LLLD stretch) 10 (Post LLLD stretch) 9 (post LLLD stretch)  9 post LLLD stretch and manual 8 (post LLLD stretch and manual) 8 (post LLLD stetch and manual)  ? (Blank rows = not tested) ?  ?LE MMT: ?  ?MMT Right ?01/24/2021 Left ?01/24/2021 Right ?02/19/2021 Right  ?03/05/2021  ?Hip flexion 4+/5 5/5    ?Hip extension 3+/5 4/5    ?Hip abduction 4/5 4/5    ?Knee flexion 4+/5 5/5 5/5 DNT  ?Knee extension 3+/5 with pain 5/5 4/5 with pain DNT  ? (Blank rows = not tested) ?  ?  ?TODAY'S TREATMENT: ?[facility] Adult PT Treatment:                                                DATE: 04/14/2021 ?Therapeutic Exercise: ?NuStep lvl 6 UE/LE x 6 min for R knee stretch and gathering subjective info ?30" STS 11 reps ?Standing TKE against ball on wall 5" hold 2x10 ?LAQ 2x15 R ?Supine heel slide with  strap 2x10 - 5" hold ?Supine quad set with half foam under heel and towel under knee 2x10 - 5" hold ?Supine LLLD stretch for ext, heel on 1/2 foam roll, 5# above knee x 5 mins  ?Supine hamstring stretch with strap 2x45" R ?Manual Therapy (concentrating on increasing extensibility of restricted tissue to reduce discomfort and improve mechanics in functional movement): ?AP mobs to R knee for improving extension Grade II ?Modalities: ?Ice to R knee with R LE elevated x 8 min post session ? ? ?[facility] Adult PT Treatment:                                                DATE: 04/09/2021 ?Therapeutic Exercise: ?NuStep lvl 6 UE/LE x 6 min for R knee stretch and gathering subjective info ?Standing TKE against ball on wall 5" hold 2x10 ?LAQ 2x15 R ?Supine heel slide with strap 2x10 - 5" hold ?Supine quad set with half foam under heel 2x10 - 5" hold ?Supine LLLD stretch for ext, heel on 1/2 foam roll, 6# above knee x  4 mins  ?Supine hamstring stretch with strap 2x45" R ?Manual Therapy (concentrating on increasing extensibility of restricted tissue to reduce discomfort and improve mechanics in functional movement): ?Manual flexion stretch in supine, leg hanging off EOM ?AP mobs to R knee for improving extension Grade II ?Modalities: ?Ice to R knee with R LE elevated x 8 min post session ? ? ?[facility] Adult PT Treatment:                                                DATE: 04/07/2021 ?Therapeutic Exercise: ?NuStep lvl 6 UE/LE x 5 min for R knee stretch and gathering subjective info ?Standing TKE against ball on wall 5" hold 2x10 ?LAQ 2x15 R ?Supine heel slide with strap 2x10 - 5" hold ?Supine quad set with half foam under heel 2x10 - 5" hold ?Supine LLLD stretch for ext, heel on 1/2 foam roll, 6# above knee x 4 mins  ?Supine hamstring stretch with strap 2x45" R ?Manual Therapy (concentrating on increasing extensibility of restricted tissue to reduce discomfort and improve mechanics in functional movement): ?Manual flexion stretch sitting edge of mat ?AP mobs to R knee for improving extension Grade II ?Gait Training ?Amb with SPC in L hand CGA x 181f with PT VC for step-to pattern and sequencing ?Modalities: ?Ice to R knee with R LE elevated x 8 min post session ? ? ? ?PATIENT EDUCATION:  ?Education details: Educated on POC, prognosis, and HEP ?Person educated: Patient ?Education method: Explanation, Demonstration, and Handouts ?Education comprehension: verbalized understanding and returned demonstration ?  ?  ?GAIT: ?Distance walked: 241f?Assistive device utilized: WaEnvironmental consultant 2 wheeled ?Level of assistance: Modified independence ?Comments: step-to gait with decreased knee extension, slowed gait speed ? ?HOME EXERCISE PROGRAM: ?Access Code: [email or web address]: [email or web address] ?Date: 04/02/2021 ?Prepared by: StEvelene Croon ?Exercises ?- Long Sitting Quad Set with Towel Roll Under Heel  - 8 x daily - 7 x weekly - 2-3  sets - 10 reps - 5 sec hold ?- Supine Heel Slide with Strap  - 8 x daily - 7 x weekly - 2-3 sets - 10 reps - 5-10 sec hold ?- Seated Heel  Slide  - 5 x daily - 7 x weekly - 2-3 sets - 10 reps - 5 sec hold ?- Seated Hamstring Stretch  - 1 x daily - 7 x weekly - 3 sets - 10 reps ?Added 04/02/2021 - Standing Terminal Knee Extension at Wall with Ball  - 1 x daily - 7 x weekly - 3 sets - 10 reps ? ?  ?ASSESSMENT: ?  ?CLINICAL IMPRESSION: ?Patient presents to PT ambulating with SPC and reports HEP compliance. Session today focused on R quad strengthening and R knee ROM. His flexion ROM continues to improve gradually, but is still lacking around 8?-9? of extension. Patient was able to tolerate all prescribed exercises with no adverse effects. Patient continues to benefit from skilled PT services and should be progressed as able to improve functional independence.  ? ?  ?GOALS: ?Goals reviewed with patient? Yes ?  ? ?LONG TERM GOALS:  ?  ?LTG Name Target Date Goal status  ?1 Pt will self report R knee pain no greater than 2/10 for improved comfort and functional ability ?Baseline: 10/10 at worst 04/30/2021 UPDATED ?INITIAL  ?2 Pt will improve R knee ROM to no less than range of 5-110 degrees post surgery for improving functional mobility ?Baseline: 20-60 degrees 04/30/2021 UPDATED ?INITIAL  ?3 Pt will increase 30 Second Sit to Stand rep count to no less than 10 reps for improved balance, strength, and functional mobility ?Baseline: 5 reps - with UE support ?Current 04/14/2021: 11 reps 04/30/2021 MET ?04/14/2021  ?4 Pt will decrease TUG time to no greater than 20 seconds with LRAD for improved balance and functional mobility ?Baseline: 65 sec with FWW 04/30/2021 UPDATED ?INITIAL  ?  ?PLAN: ?PT FREQUENCY: 2x/week ?  ?PT DURATION: 8 weeks ?  ?PLANNED INTERVENTIONS: Therapeutic exercises, Therapeutic activity, Neuro Muscular re-education, Balance training, Gait training, Patient/Family education, Joint mobilization, Stair training,  Aquatic Therapy, Dry Needling, Cryotherapy, Moist heat, Taping, and Manual therapy ? ?Check all possible CPT codes: 51025- Therapeutic Exercise, [phone]- Neuro Re-education, [phone] - Gait Training, [phone] - Manual Th

## 2021-04-15 NOTE — Therapy (Signed)
?OUTPATIENT PHYSICAL THERAPY TREATMENT NOTE ? ? ?Patient Name: Matthew Stark ?MRN: [id number] ?DOB:1960-12-13, 61 y.o., male ?Today's Date: 04/16/2021 ? ?PCP: [facility] ?REFERRING PROVIDER: Willaim Sheng, MD ? ? PT End of Session - 04/16/21 1117   ? ? Visit Number 19   ? Number of Visits 24   ? Date for PT Re-Evaluation 04/30/21   ? Authorization Type [employer]   ? Authorization Time Period Pending   ? PT Start Time 1115   ? PT Stop Time 1200   ? PT Time Calculation (min) 45 min   ? Activity Tolerance Patient tolerated treatment well   ? Behavior During Therapy [facility] for tasks assessed/performed   ? ?  ?  ? ?  ? ? ? ? ? ? ? ? ? ? ?Past Medical History:  ?Diagnosis Date  ? Arthritis   ? Hypertension   ? ?Past Surgical History:  ?Procedure Laterality Date  ? MOUTH SURGERY    ? 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 Procedure: TOTAL KNEE ARTHROPLASTY;  Surgeon: Willaim Sheng, MD;  Location: [facility];  Service: Orthopedics;  Laterality: Right;  ? ?There are no problems to display for this patient. ? ? ?REFERRING DIAG: Post OP right total knee replacement 03/02/21 ? ?THERAPY DIAG:  ?Chronic pain of right knee ? ?Other abnormalities of gait and mobility ? ?Muscle weakness (generalized) ? ?Stiffness of right knee, not elsewhere classified ? ?PERTINENT HISTORY: Rt knee TKA 03/02/2021 ? ?SUBJECTIVE: Patient presents to PT ambulating without AD and reports decreased overall knee pain. ? ?Pain: ?Are you having pain? Yes ?NPRS scale: 1/10  ?Pain location: Knee ?Pain orientation: Right  ?PAIN TYPE: aching ?Pain description: intermittent  ? ? ?OBJECTIVE:  ?Outcomes: ?TUG: 65" w/ FWW ?30 Second Sit to Stand: 5 reps w/ UE support ?04/14/2021: 30" STS: 11 reps no UE support ? ?LE AROM/PROM: ?  ?A/PROM Right ?01/24/2021 Right ?02/05/2021 Right ?03/05/2021 Right ?03/09/2021 Right ?03/12/2021 Right ?03/16/2021 Right ?03/18/2021 Right ?03/31/2021 Right ?04/02/2021 Right ?04/07/2021 Right ?04/09/2021 Right ?04/14/2021  Right ?04/16/2021  ?Knee flexion 125/127p! 140/145 60 75 83 85 86 89 90 95 AAROM 100 ?AROM 102 AAROM 104  ?Knee extension -12/ -8p! '12 20 16 15 10 ' (post LLLD stretch) 10 (Post LLLD stretch) 10 (Post LLLD stretch) 9 (post LLLD stretch)  9 post LLLD stretch and manual 8 (post LLLD stretch and manual) 8 (post LLLD stetch and manual) 8(Post LLD stretch and manual)  ? (Blank rows = not tested) ?  ?LE MMT: ?  ?MMT Right ?01/24/2021 Left ?01/24/2021 Right ?02/19/2021 Right  ?03/05/2021  ?Hip flexion 4+/5 5/5    ?Hip extension 3+/5 4/5    ?Hip abduction 4/5 4/5    ?Knee flexion 4+/5 5/5 5/5 DNT  ?Knee extension 3+/5 with pain 5/5 4/5 with pain DNT  ? (Blank rows = not tested) ?  ?  ?TODAY'S TREATMENT: ?[facility] Adult PT Treatment:                                                DATE: 04/16/2021 ?Therapeutic Exercise: ?Recumbent bike, rocking for ROM x5 mins ?STS x10 (cues to weight shift to R as he favors L) ?Standing TKE against ball on wall 5" hold 2x10 ?LAQ 2x15 R ?Supine heel slide with strap 2x10 - 5" hold ?Supine quad set with half foam under  heel and 2 towels under knee 2x10 - 5" hold ?Supine LLLD stretch for ext, heel on 1/2 foam roll, 5# above knee x 5 mins  ?Manual Therapy (concentrating on increasing extensibility of restricted tissue to reduce discomfort and improve mechanics in functional movement): ?AP mobs to R knee for improving extension Grade II ?Modalities: ?Ice to R knee with R LE elevated x 8 min post session ? ? ?[facility] Adult PT Treatment:                                                DATE: 04/14/2021 ?Therapeutic Exercise: ?NuStep lvl 6 UE/LE x 6 min for R knee stretch and gathering subjective info ?30" STS 11 reps ?Standing TKE against ball on wall 5" hold 2x10 ?LAQ 2x15 R ?Supine heel slide with strap 2x10 - 5" hold ?Supine quad set with half foam under heel and towel under knee 2x10 - 5" hold ?Supine LLLD stretch for ext, heel on 1/2 foam roll, 5# above knee x 5 mins  ?Supine hamstring stretch with strap 2x45"  R ?Manual Therapy (concentrating on increasing extensibility of restricted tissue to reduce discomfort and improve mechanics in functional movement): ?AP mobs to R knee for improving extension Grade II ?Modalities: ?Ice to R knee with R LE elevated x 8 min post session ? ? ?[facility] Adult PT Treatment:                                                DATE: 04/09/2021 ?Therapeutic Exercise: ?NuStep lvl 6 UE/LE x 6 min for R knee stretch and gathering subjective info ?Standing TKE against ball on wall 5" hold 2x10 ?LAQ 2x15 R ?Supine heel slide with strap 2x10 - 5" hold ?Supine quad set with half foam under heel 2x10 - 5" hold ?Supine LLLD stretch for ext, heel on 1/2 foam roll, 6# above knee x 4 mins  ?Supine hamstring stretch with strap 2x45" R ?Manual Therapy (concentrating on increasing extensibility of restricted tissue to reduce discomfort and improve mechanics in functional movement): ?Manual flexion stretch in supine, leg hanging off EOM ?AP mobs to R knee for improving extension Grade II ?Modalities: ?Ice to R knee with R LE elevated x 8 min post session ? ? ? ?PATIENT EDUCATION:  ?Education details: Educated on POC, prognosis, and HEP ?Person educated: Patient ?Education method: Explanation, Demonstration, and Handouts ?Education comprehension: verbalized understanding and returned demonstration ?  ?  ?GAIT: ?Distance walked: 60f ?Assistive device utilized: WEnvironmental consultant- 2 wheeled ?Level of assistance: Modified independence ?Comments: step-to gait with decreased knee extension, slowed gait speed ? ?HOME EXERCISE PROGRAM: ?Access Code: [email or web address]?URL: [email or web address] ?Date: 04/02/2021 ?Prepared by: SEvelene Croon? ?Exercises ?- Long Sitting Quad Set with Towel Roll Under Heel  - 8 x daily - 7 x weekly - 2-3 sets - 10 reps - 5 sec hold ?- Supine Heel Slide with Strap  - 8 x daily - 7 x weekly - 2-3 sets - 10 reps - 5-10 sec hold ?- Seated Heel Slide  - 5 x daily - 7 x weekly - 2-3 sets - 10 reps - 5  sec hold ?- Seated Hamstring Stretch  - 1 x daily - 7 x weekly - 3 sets -  10 reps ?Added 04/02/2021 - Standing Terminal Knee Extension at Wall with Ball  - 1 x daily - 7 x weekly - 3 sets - 10 reps ? ?  ?ASSESSMENT: ?  ?CLINICAL IMPRESSION: ?Patient presents to PT ambulating without AD and states he stopped using the cane yesterday and that it is going well so far. He has an antalgic gait and decreased weight bearing and stride length on the right side. Session today continued to focused on R quad strengthening and ROM. His flexion ROM continues to improve gradually, but his extension is still lacking. Plan to incorporate gait training into next session. Patient was able to tolerate all prescribed exercises with no adverse effects. Patient continues to benefit from skilled PT services and should be progressed as able to improve functional independence. ? ?  ?GOALS: ?Goals reviewed with patient? Yes ?  ? ?LONG TERM GOALS:  ?  ?LTG Name Target Date Goal status  ?1 Pt will self report R knee pain no greater than 2/10 for improved comfort and functional ability ?Baseline: 10/10 at worst 04/30/2021 UPDATED ?INITIAL  ?2 Pt will improve R knee ROM to no less than range of 5-110 degrees post surgery for improving functional mobility ?Baseline: 20-60 degrees 04/30/2021 UPDATED ?INITIAL  ?3 Pt will increase 30 Second Sit to Stand rep count to no less than 10 reps for improved balance, strength, and functional mobility ?Baseline: 5 reps - with UE support ?Current 04/14/2021: 11 reps 04/30/2021 MET ?04/14/2021  ?4 Pt will decrease TUG time to no greater than 20 seconds with LRAD for improved balance and functional mobility ?Baseline: 65 sec with FWW 04/30/2021 UPDATED ?INITIAL  ?  ?PLAN: ?PT FREQUENCY: 2x/week ?  ?PT DURATION: 8 weeks ?  ?PLANNED INTERVENTIONS: Therapeutic exercises, Therapeutic activity, Neuro Muscular re-education, Balance training, Gait training, Patient/Family education, Joint mobilization, Stair training, Aquatic  Therapy, Dry Needling, Cryotherapy, Moist heat, Taping, and Manual therapy ? ?Check all possible CPT codes: 97110- Therapeutic Exercise, [phone]- Neuro Re-education, [phone] - Gait Training, [phone] - Manual Therapy, 9

## 2021-04-16 ENCOUNTER — Ambulatory Visit: Payer: Medicaid Other

## 2021-04-16 DIAGNOSIS — G8929 Other chronic pain: Secondary | ICD-10-CM

## 2021-04-16 DIAGNOSIS — M6281 Muscle weakness (generalized): Secondary | ICD-10-CM

## 2021-04-16 DIAGNOSIS — R2689 Other abnormalities of gait and mobility: Secondary | ICD-10-CM

## 2021-04-16 DIAGNOSIS — M25561 Pain in right knee: Secondary | ICD-10-CM | POA: Diagnosis not present

## 2021-04-16 DIAGNOSIS — M25661 Stiffness of right knee, not elsewhere classified: Secondary | ICD-10-CM

## 2021-04-21 ENCOUNTER — Ambulatory Visit: Payer: Medicaid Other

## 2021-04-21 DIAGNOSIS — M6281 Muscle weakness (generalized): Secondary | ICD-10-CM

## 2021-04-21 DIAGNOSIS — M25561 Pain in right knee: Secondary | ICD-10-CM | POA: Diagnosis not present

## 2021-04-21 DIAGNOSIS — G8929 Other chronic pain: Secondary | ICD-10-CM

## 2021-04-21 DIAGNOSIS — R2689 Other abnormalities of gait and mobility: Secondary | ICD-10-CM

## 2021-04-21 NOTE — Therapy (Signed)
?OUTPATIENT PHYSICAL THERAPY TREATMENT NOTE ? ? ?Patient Name: Matthew Stark ?MRN: [id number] ?DOB:[birth year], 61 y.o., male ?Today's Date: 04/21/2021 ? ?PCP: [facility] ?REFERRING PROVIDER: [facility], [facility] ? ? PT End of Session - 04/21/21 1121   ? ? Visit Number 20   ? Number of Visits 24   ? Date for PT Re-Evaluation 04/30/21   ? Authorization Type [employer]   ? Authorization Time Period Pending   ? PT Start Time 1130   ? PT Stop Time 1208   ? PT Time Calculation (min) 38 min   ? Activity Tolerance Patient tolerated treatment well   ? Behavior During Therapy [facility] for tasks assessed/performed   ? ?  ?  ? ?  ? ? ? ? ? ? ? ? ? ? ? ?Past Medical History:  ?Diagnosis Date  ? Arthritis   ? Hypertension   ? ?Past Surgical History:  ?Procedure Laterality Date  ? MOUTH SURGERY    ? 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 Procedure: TOTAL KNEE ARTHROPLASTY;  Surgeon: Willaim Sheng, MD;  Location: [facility];  Service: Orthopedics;  Laterality: Right;  ? ?There are no problems to display for this patient. ? ? ?REFERRING DIAG: Post OP right total knee replacement 03/02/21 ? ?THERAPY DIAG:  ?Chronic pain of right knee ? ?Other abnormalities of gait and mobility ? ?Muscle weakness (generalized) ? ?PERTINENT HISTORY: Rt knee TKA 03/02/2021 ? ?SUBJECTIVE: ?Pt presents to PT with slight R knee pain. Continues to perform HEP with no adverse effect. Pt is ready to begin PT at this time.  ? ?Pain: ?Are you having pain? Yes ?NPRS scale: 1/10  ?Pain location: Knee ?Pain orientation: Right  ?PAIN TYPE: aching ?Pain description: intermittent  ? ? ?OBJECTIVE:  ?Outcomes: ?TUG: 65" w/ FWW ?30 Second Sit to Stand: 5 reps w/ UE support ?04/14/2021: 30" STS: 11 reps no UE support ? ?LE AROM/PROM: ?  ?A/PROM Right ?03/16/2021 Right ?03/18/2021 Right ?03/31/2021 Right ?04/02/2021 Right ?04/07/2021 Right ?04/09/2021 Right ?04/14/2021 Right ?04/16/2021 Right ?04/21/2021  ?Knee flexion 85 86 89 90 95 AAROM 100 ?AROM 102 AAROM 104 105 ?AAROM   ?Knee extension 10 (post LLLD stretch) 10 (Post LLLD stretch) 10 (Post LLLD stretch) 9 (post LLLD stretch)  9 post LLLD stretch and manual 8 (post LLLD stretch and manual) 8 (post LLLD stetch and manual) 8(Post LLD stretch and manual) 7 (post manual therapy)  ? (Blank rows = not tested) ?  ?LE MMT: ?  ?MMT Right ?01/24/2021 Left ?01/24/2021 Right ?02/19/2021 Right  ?03/05/2021  ?Hip flexion 4+/5 5/5    ?Hip extension 3+/5 4/5    ?Hip abduction 4/5 4/5    ?Knee flexion 4+/5 5/5 5/5 DNT  ?Knee extension 3+/5 with pain 5/5 4/5 with pain DNT  ? (Blank rows = not tested) ?  ?  ?TODAY'S TREATMENT: ?[facility] Adult PT Treatment:                                                DATE: 04/21/2021 ?Therapeutic Exercise: ?Recumbent bike, rocking for ROM x 5 mins - unable to perform full revolution  ?STS 2x10 (cues to weight shift to R as he favors L) ?Standing TKE against ball on wall 5" hold 2x10 ?LAQ 3x10 R 3# ?Supine heel slide with strap x 10 - 5" hold ?Standing hamstring stretch on  6in 2x30" R ?Seated hamstring curl 3x10 GTB ?Supine LLLD stretch for ext, heel on 1/2 foam roll, 5# above knee x 5 mins  ?Manual Therapy (concentrating on increasing extensibility of restricted tissue to reduce discomfort and improve mechanics in functional movement): ?AP mobs to R knee for improving extension Grade II ?Manual hamstring stretch 3x45" - supine ?Modalities: ?Ice to R knee with R LE elevated x 8 min post session ? ?[facility] Adult PT Treatment:                                                DATE: 04/16/2021 ?Therapeutic Exercise: ?Recumbent bike, rocking for ROM x5 mins ?STS x10 (cues to weight shift to R as he favors L) ?Standing TKE against ball on wall 5" hold 2x10 ?LAQ 2x15 R ?Supine heel slide with strap 2x10 - 5" hold ?Supine quad set with half foam under heel and 2 towels under knee 2x10 - 5" hold ?Supine LLLD stretch for ext, heel on 1/2 foam roll, 5# above knee x 5 mins  ?Manual Therapy (concentrating on increasing extensibility of  restricted tissue to reduce discomfort and improve mechanics in functional movement): ?AP mobs to R knee for improving extension Grade II ?Modalities: ?Ice to R knee with R LE elevated x 8 min post session ? ? ?[facility] Adult PT Treatment:                                                DATE: 04/14/2021 ?Therapeutic Exercise: ?NuStep lvl 6 UE/LE x 6 min for R knee stretch and gathering subjective info ?30" STS 11 reps ?Standing TKE against ball on wall 5" hold 2x10 ?LAQ 2x15 R ?Supine heel slide with strap 2x10 - 5" hold ?Supine quad set with half foam under heel and towel under knee 2x10 - 5" hold ?Supine LLLD stretch for ext, heel on 1/2 foam roll, 5# above knee x 5 mins  ?Manual Therapy (concentrating on increasing extensibility of restricted tissue to reduce discomfort and improve mechanics in functional movement): ?AP mobs to R knee for improving extension Grade II ?Modalities: ?Ice to R knee with R LE elevated x 8 min post session ? ?PATIENT EDUCATION:  ?Education details: Educated on POC, prognosis, and HEP ?Person educated: Patient ?Education method: Explanation, Demonstration, and Handouts ?Education comprehension: verbalized understanding and returned demonstration ?  ?  ?GAIT: ?Distance walked: 38f ?Assistive device utilized: WEnvironmental consultant- 2 wheeled ?Level of assistance: Modified independence ?Comments: step-to gait with decreased knee extension, slowed gait speed ? ?HOME EXERCISE PROGRAM: ?Access Code: [email or web address]?URL: [email or web address] ?Date: 04/21/2021 ?Prepared by: DOctavio Manns? ?Exercises ?- Long Sitting Quad Set with Towel Roll Under Heel  - 8 x daily - 7 x weekly - 2-3 sets - 10 reps - 5 sec hold ?- Supine Heel Slide with Strap  - 8 x daily - 7 x weekly - 2-3 sets - 10 reps - 5-10 sec hold ?- Seated Heel Slide  - 5 x daily - 7 x weekly - 2-3 sets - 10 reps - 5 sec hold ?- Seated Hamstring Stretch  - 1 x daily - 7 x weekly - 3 sets - 10 reps ?- Standing Terminal Knee Extension at [employer]  BDiona Foley -  1 x daily - 7 x weekly - 3 sets - 10 reps ?- Standing Hamstring Stretch with Step  - 1 x daily - 7 x weekly - 3 reps - 30 sec hold ?- Seated Passive Knee Extension with Weight  - 1 x daily - 7 x weekly - 1 reps - 5 min hold ? ?  ?ASSESSMENT: ?  ?CLINICAL IMPRESSION: ?Pt was able to complete all prescribed exercises with no adverse effect. Therapy today continued to progress R LE strengthening and R knee ROM. Pt demonstrated slightly improved range today, as well as continued general improvement in activity tolerance. HEP updated for continued improvement in R knee extension. Pt continues to benefit from skilled PT and will continue to be seen and progressed as able.  ? ?  ?GOALS: ?Goals reviewed with patient? Yes ?  ? ?LONG TERM GOALS:  ?  ?LTG Name Target Date Goal status  ?1 Pt will self report R knee pain no greater than 2/10 for improved comfort and functional ability ?Baseline: 10/10 at worst 04/30/2021 UPDATED ?INITIAL  ?2 Pt will improve R knee ROM to no less than range of 5-110 degrees post surgery for improving functional mobility ?Baseline: 20-60 degrees 04/30/2021 UPDATED ?INITIAL  ?3 Pt will increase 30 Second Sit to Stand rep count to no less than 10 reps for improved balance, strength, and functional mobility ?Baseline: 5 reps - with UE support ?Current 04/14/2021: 11 reps 04/30/2021 MET ?04/14/2021  ?4 Pt will decrease TUG time to no greater than 20 seconds with LRAD for improved balance and functional mobility ?Baseline: 65 sec with FWW 04/30/2021 UPDATED ?INITIAL  ?  ?PLAN: ?PT FREQUENCY: 2x/week ?  ?PT DURATION: 8 weeks ?  ?PLANNED INTERVENTIONS: Therapeutic exercises, Therapeutic activity, Neuro Muscular re-education, Balance training, Gait training, Patient/Family education, Joint mobilization, Stair training, Aquatic Therapy, Dry Needling, Cryotherapy, Moist heat, Taping, and Manual therapy ? ?Check all possible CPT codes: 97110- Therapeutic Exercise, [phone]- Neuro Re-education, [phone] - Gait  Training, [phone] - Manual Therapy, 97530 - Therapeutic Activities, [phone] - Self Care, and [phone] - Vaso   ? ?Ward Chatters PT ?04/21/21 1:48 PM ? ? ? ?

## 2021-04-22 NOTE — Therapy (Signed)
?OUTPATIENT PHYSICAL THERAPY TREATMENT NOTE ? ? ?Patient Name: Matthew Stark ?MRN: [id number] ?DOB:07-01-1960, 61 y.o., male ?Today's Date: 04/23/2021 ? ?PCP: [facility] ?REFERRING PROVIDER: Willaim Sheng, MD ? ? PT End of Session - 04/23/21 1131   ? ? Visit Number 21   ? Number of Visits 24   ? Date for PT Re-Evaluation 04/30/21   ? Authorization Type [employer]   ? Authorization Time Period 12 more 04/21/2021-06/05/2021   ? Authorization - Visit Number 1   ? Authorization - Number of Visits 12   ? PT Start Time 1130   ? PT Stop Time 6269   ? PT Time Calculation (min) 45 min   ? Activity Tolerance Patient tolerated treatment well   ? Behavior During Therapy [facility] [place] for tasks assessed/performed   ? ?  ?  ? ?  ? ? ? ? ? ? ? ? ? ? ? ? ?Past Medical History:  ?Diagnosis Date  ? Arthritis   ? Hypertension   ? ?Past Surgical History:  ?Procedure Laterality Date  ? MOUTH SURGERY    ? 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 Procedure: TOTAL KNEE ARTHROPLASTY;  Surgeon: Willaim Sheng, MD;  Location: [facility];  Service: Orthopedics;  Laterality: Right;  ? ?There are no problems to display for this patient. ? ? ?REFERRING DIAG: Post OP right total knee replacement 03/02/21 ? ?THERAPY DIAG:  ?Chronic pain of right knee ? ?Other abnormalities of gait and mobility ? ?Muscle weakness (generalized) ? ?Stiffness of right knee, not elsewhere classified ? ?PERTINENT HISTORY: Rt knee TKA 03/02/2021 ? ?SUBJECTIVE: Patient presents with no current pain. Reports HEP compliance. ? ?Pain: ?Are you having pain? No ?NPRS scale: 0/10  ?Pain location: Knee ?Pain orientation: Right  ?PAIN TYPE: aching ?Pain description: intermittent  ? ? ?OBJECTIVE:  ?Outcomes: ?TUG: 65" w/ FWW ?30 Second Sit to Stand: 5 reps w/ UE support ?04/14/2021: 30" STS: 11 reps no UE support ? ?LE AROM/PROM: ?  ?A/PROM Right ?03/16/2021 Right ?03/18/2021 Right ?03/31/2021 Right ?04/02/2021 Right ?04/07/2021 Right ?04/09/2021 Right ?04/14/2021 Right ?04/16/2021  Right ?04/21/2021 Right ?04/23/2021  ?Knee flexion 85 86 89 90 95 AAROM 100 ?AROM 102 AAROM 104 105 ?AAROM 106 ?AAROM  ?Knee extension 10 (post LLLD stretch) 10 (Post LLLD stretch) 10 (Post LLLD stretch) 9 (post LLLD stretch)  9 post LLLD stretch and manual 8 (post LLLD stretch and manual) 8 (post LLLD stetch and manual) 8(Post LLD stretch and manual) 7 (post manual therapy) 7 (post manual and LLD stretch)  ? (Blank rows = not tested) ?  ?LE MMT: ?  ?MMT Right ?01/24/2021 Left ?01/24/2021 Right ?02/19/2021 Right  ?03/05/2021  ?Hip flexion 4+/5 5/5    ?Hip extension 3+/5 4/5    ?Hip abduction 4/5 4/5    ?Knee flexion 4+/5 5/5 5/5 DNT  ?Knee extension 3+/5 with pain 5/5 4/5 with pain DNT  ? (Blank rows = not tested) ?  ?  ?TODAY'S TREATMENT: ?[facility] Adult PT Treatment:                                                DATE: 04/23/2021 ?Therapeutic Exercise: ?Recumbent bike, rocking for ROM x 5 mins - unable to perform full revolution  ?STS 2x10 (cues to weight shift to R as he favors L) ?Standing TKE against ball on wall  5" hold 2x10 ?LAQ 3x10 R 3# ?Supine heel slide with strap x 10 - 5" hold ?Supine LLLD stretch for ext, heel on 1/2 foam roll, 5# above knee x 5 mins  ?Manual Therapy (concentrating on increasing extensibility of restricted tissue to reduce discomfort and improve mechanics in functional movement): ?Manual hamstring stretch 3x45" - supine ?Modalities: ?Ice to R knee with R LE elevated x 8 min post session ? ? ?[facility] Adult PT Treatment:                                                DATE: 04/21/2021 ?Therapeutic Exercise: ?Recumbent bike, rocking for ROM x 5 mins - unable to perform full revolution  ?STS 2x10 (cues to weight shift to R as he favors L) ?Standing TKE against ball on wall 5" hold 2x10 ?LAQ 3x10 R 3# ?Supine heel slide with strap x 10 - 5" hold ?Standing hamstring stretch on 6in 2x30" R ?Seated hamstring curl 3x10 GTB ?Supine LLLD stretch for ext, heel on 1/2 foam roll, 5# above knee x 5 mins  ?Manual  Therapy (concentrating on increasing extensibility of restricted tissue to reduce discomfort and improve mechanics in functional movement): ?AP mobs to R knee for improving extension Grade II ?Manual hamstring stretch 3x45" - supine ?Modalities: ?Ice to R knee with R LE elevated x 8 min post session ? ?[facility] Adult PT Treatment:                                                DATE: 04/16/2021 ?Therapeutic Exercise: ?Recumbent bike, rocking for ROM x5 mins ?STS x10 (cues to weight shift to R as he favors L) ?Standing TKE against ball on wall 5" hold 2x10 ?LAQ 2x15 R ?Supine heel slide with strap 2x10 - 5" hold ?Supine quad set with half foam under heel and 2 towels under knee 2x10 - 5" hold ?Supine LLLD stretch for ext, heel on 1/2 foam roll, 5# above knee x 5 mins  ?Manual Therapy (concentrating on increasing extensibility of restricted tissue to reduce discomfort and improve mechanics in functional movement): ?AP mobs to R knee for improving extension Grade II ?Modalities: ?Ice to R knee with R LE elevated x 8 min post session ? ? ?PATIENT EDUCATION:  ?Education details: Educated on POC, prognosis, and HEP ?Person educated: Patient ?Education method: Explanation, Demonstration, and Handouts ?Education comprehension: verbalized understanding and returned demonstration ?  ?  ?GAIT: ?Distance walked: 31f ?Assistive device utilized: WEnvironmental consultant- 2 wheeled ?Level of assistance: Modified independence ?Comments: step-to gait with decreased knee extension, slowed gait speed ? ?HOME EXERCISE PROGRAM: ?Access Code: [email or web address]?URL: [email or web address] ?Date: 04/21/2021 ?Prepared by: DOctavio Manns? ?Exercises ?- Long Sitting Quad Set with Towel Roll Under Heel  - 8 x daily - 7 x weekly - 2-3 sets - 10 reps - 5 sec hold ?- Supine Heel Slide with Strap  - 8 x daily - 7 x weekly - 2-3 sets - 10 reps - 5-10 sec hold ?- Seated Heel Slide  - 5 x daily - 7 x weekly - 2-3 sets - 10 reps - 5 sec hold ?- Seated Hamstring Stretch   - 1 x daily - 7 x weekly - 3 sets -  10 reps ?- Standing Terminal Knee Extension at Wall with Ball  - 1 x daily - 7 x weekly - 3 sets - 10 reps ?- Standing Hamstring Stretch with Step  - 1 x daily - 7 x weekly - 3 reps - 30 sec hold ?- Seated Passive Knee Extension with Weight  - 1 x daily - 7 x weekly - 1 reps - 5 min hold ? ?  ?ASSESSMENT: ?  ?CLINICAL IMPRESSION: ?Patient presents to PT with no current pain and reports HEP compliance. Session today continued to focused on improving R LE strength and ROM. His ROM continues to improve very gradually. Patient was able to tolerate all prescribed exercises with no adverse effects. Patient continues to benefit from skilled PT services and should be progressed as able to improve functional independence. ? ?  ?GOALS: ?Goals reviewed with patient? Yes ?  ? ?LONG TERM GOALS:  ?  ?LTG Name Target Date Goal status  ?1 Pt will self report R knee pain no greater than 2/10 for improved comfort and functional ability ?Baseline: 10/10 at worst 04/30/2021 UPDATED ?INITIAL  ?2 Pt will improve R knee ROM to no less than range of 5-110 degrees post surgery for improving functional mobility ?Baseline: 20-60 degrees 04/30/2021 UPDATED ?INITIAL  ?3 Pt will increase 30 Second Sit to Stand rep count to no less than 10 reps for improved balance, strength, and functional mobility ?Baseline: 5 reps - with UE support ?Current 04/14/2021: 11 reps 04/30/2021 MET ?04/14/2021  ?4 Pt will decrease TUG time to no greater than 20 seconds with LRAD for improved balance and functional mobility ?Baseline: 65 sec with FWW 04/30/2021 UPDATED ?INITIAL  ?  ?PLAN: ?PT FREQUENCY: 2x/week ?  ?PT DURATION: 8 weeks ?  ?PLANNED INTERVENTIONS: Therapeutic exercises, Therapeutic activity, Neuro Muscular re-education, Balance training, Gait training, Patient/Family education, Joint mobilization, Stair training, Aquatic Therapy, Dry Needling, Cryotherapy, Moist heat, Taping, and Manual therapy ? ?Check all possible CPT  codes: 97110- Therapeutic Exercise, [phone]- Neuro Re-education, [phone] - Gait Training, [phone] - Manual Therapy, 97530 - Therapeutic Activities, [phone] - Self Care, and [phone] - Vaso   ? ?Evelene Croon PTA ?[id number]

## 2021-04-23 ENCOUNTER — Ambulatory Visit: Payer: Medicaid Other

## 2021-04-23 DIAGNOSIS — M25561 Pain in right knee: Secondary | ICD-10-CM | POA: Diagnosis not present

## 2021-04-23 DIAGNOSIS — R2689 Other abnormalities of gait and mobility: Secondary | ICD-10-CM

## 2021-04-23 DIAGNOSIS — M6281 Muscle weakness (generalized): Secondary | ICD-10-CM

## 2021-04-23 DIAGNOSIS — M25661 Stiffness of right knee, not elsewhere classified: Secondary | ICD-10-CM

## 2021-04-23 DIAGNOSIS — G8929 Other chronic pain: Secondary | ICD-10-CM

## 2021-04-25 NOTE — Therapy (Signed)
?OUTPATIENT PHYSICAL THERAPY TREATMENT NOTE ? ? ?Patient Name: Matthew Stark ?MRN: [id number] ?DOB:September 10, 1960, 61 y.o., male ?Today's Date: 04/28/2021 ? ?PCP: [facility] ?REFERRING PROVIDER: [facility] ? ? PT End of Session - 04/28/21 1131   ? ? Visit Number 22   ? Number of Visits 24   ? Date for PT Re-Evaluation 04/30/21   ? Authorization Type [employer]   ? Authorization Time Period 12 more 04/21/2021-06/05/2021   ? Authorization - Visit Number 2   ? Authorization - Number of Visits 12   ? PT Start Time 1130   ? PT Stop Time 1210   ? PT Time Calculation (min) 40 min   ? Activity Tolerance Patient tolerated treatment well   ? Behavior During Therapy [facility] for tasks assessed/performed   ? ?  ?  ? ?  ? ? ? ? ?Past Medical History:  ?Diagnosis Date  ? Arthritis   ? Hypertension   ? ?Past Surgical History:  ?Procedure Laterality Date  ? MOUTH SURGERY    ? 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 Procedure: TOTAL KNEE ARTHROPLASTY;  Surgeon: Willaim Sheng, MD;  Location: [facility];  Service: Orthopedics;  Laterality: Right;  ? ?There are no problems to display for this patient. ? ? ?REFERRING DIAG: Post OP right total knee replacement 03/02/21 ? ?THERAPY DIAG:  ?Chronic pain of right knee ? ?Other abnormalities of gait and mobility ? ?Muscle weakness (generalized) ? ?Stiffness of right knee, not elsewhere classified ? ?PERTINENT HISTORY: Rt knee TKA 03/02/2021 ? ?SUBJECTIVE: Patient presents with no current pain. Reports HEP compliance. ? ?Pain: ?Are you having pain? No ?NPRS scale: 0/10  ?Pain location: Knee ?Pain orientation: Right  ?PAIN TYPE: aching ?Pain description: intermittent  ? ? ?OBJECTIVE:  ?Outcomes: ?TUG: 65" w/ FWW ?30 Second Sit to Stand: 5 reps w/ UE support ?04/14/2021: 30" STS: 11 reps no UE support ? ?LE AROM/PROM: ?  ?A/PROM Right ?03/16/2021 Right ?03/18/2021 Right ?03/31/2021 Right ?04/02/2021 Right ?04/07/2021 Right ?04/09/2021 Right ?04/14/2021 Right ?04/16/2021 Right ?04/21/2021  Right ?04/23/2021 Right ?04/28/2021  ?Knee flexion 85 86 89 90 95 AAROM 100 ?AROM 102 AAROM 104 105 ?AAROM 106 ?AAROM 108 AAROM  ?Knee extension 10 (post LLLD stretch) 10 (Post LLLD stretch) 10 (Post LLLD stretch) 9 (post LLLD stretch)  9 post LLLD stretch and manual 8 (post LLLD stretch and manual) 8 (post LLLD stetch and manual) 8(Post LLD stretch and manual) 7 (post manual therapy) 7 (post manual and LLD stretch) 6 (Post manual and LLD stretch)  ? (Blank rows = not tested) ?  ?LE MMT: ?  ?MMT Right ?01/24/2021 Left ?01/24/2021 Right ?02/19/2021 Right  ?03/05/2021  ?Hip flexion 4+/5 5/5    ?Hip extension 3+/5 4/5    ?Hip abduction 4/5 4/5    ?Knee flexion 4+/5 5/5 5/5 DNT  ?Knee extension 3+/5 with pain 5/5 4/5 with pain DNT  ? (Blank rows = not tested) ?  ?  ?TODAY'S TREATMENT: ?[facility] Adult PT Treatment:                                                DATE: 04/28/2021 ?Therapeutic Exercise: ?Recumbent bike, rocking for ROM x 5 mins - did one full revolution backwards, unable to forwards ?STS 3x10 (cues to weight shift to R as he favors L) ?LAQ 3x10 R  3# ?SLR R 3# 3x10 ?Supine heel slide with strap x 15 - 5" hold ?Supine LLLD stretch for ext, heel on 1/2 foam roll, 5# above knee x 5 mins  ?Manual Therapy (concentrating on increasing extensibility of restricted tissue to reduce discomfort and improve mechanics in functional movement): ?Manual hamstring stretch 3x45" - supine ?AP mobs to R knee for improving extension Grade II ?Modalities: ?Ice to R knee with R LE elevated x 8 min post session ? ? ?[facility] Adult PT Treatment:                                                DATE: 04/23/2021 ?Therapeutic Exercise: ?Recumbent bike, rocking for ROM x 5 mins - unable to perform full revolution  ?STS 2x10 (cues to weight shift to R as he favors L) ?Standing TKE against ball on wall 5" hold 2x10 ?LAQ 3x10 R 3# ?Supine heel slide with strap x 10 - 5" hold ?Supine LLLD stretch for ext, heel on 1/2 foam roll, 5# above knee x 5 mins  ?Manual  Therapy (concentrating on increasing extensibility of restricted tissue to reduce discomfort and improve mechanics in functional movement): ?Manual hamstring stretch 3x45" - supine ? ?Modalities: ?Ice to R knee with R LE elevated x 8 min post session ? ? ?[facility] Adult PT Treatment:                                                DATE: 04/21/2021 ?Therapeutic Exercise: ?Recumbent bike, rocking for ROM x 5 mins - unable to perform full revolution  ?STS 2x10 (cues to weight shift to R as he favors L) ?Standing TKE against ball on wall 5" hold 2x10 ?LAQ 3x10 R 3# ?Supine heel slide with strap x 10 - 5" hold ?Standing hamstring stretch on 6in 2x30" R ?Seated hamstring curl 3x10 GTB ?Supine LLLD stretch for ext, heel on 1/2 foam roll, 5# above knee x 5 mins  ?Manual Therapy (concentrating on increasing extensibility of restricted tissue to reduce discomfort and improve mechanics in functional movement): ?AP mobs to R knee for improving extension Grade II ?Manual hamstring stretch 3x45" - supine ?Modalities: ?Ice to R knee with R LE elevated x 8 min post session ? ? ? ?PATIENT EDUCATION:  ?Education details: Educated on POC, prognosis, and HEP ?Person educated: Patient ?Education method: Explanation, Demonstration, and Handouts ?Education comprehension: verbalized understanding and returned demonstration ?  ?  ?GAIT: ?Distance walked: 52f ?Assistive device utilized: WEnvironmental consultant- 2 wheeled ?Level of assistance: Modified independence ?Comments: step-to gait with decreased knee extension, slowed gait speed ? ?HOME EXERCISE PROGRAM: ?Access Code: [email or web address]?URL: [email or web address] ?Date: 04/21/2021 ?Prepared by: DOctavio Manns? ?Exercises ?- Long Sitting Quad Set with Towel Roll Under Heel  - 8 x daily - 7 x weekly - 2-3 sets - 10 reps - 5 sec hold ?- Supine Heel Slide with Strap  - 8 x daily - 7 x weekly - 2-3 sets - 10 reps - 5-10 sec hold ?- Seated Heel Slide  - 5 x daily - 7 x weekly - 2-3 sets - 10 reps - 5 sec  hold ?- Seated Hamstring Stretch  - 1 x daily - 7 x weekly - 3 sets -  10 reps ?- Standing Terminal Knee Extension at Wall with Ball  - 1 x daily - 7 x weekly - 3 sets - 10 reps ?- Standing Hamstring Stretch with Step  - 1 x daily - 7 x weekly - 3 reps - 30 sec hold ?- Seated Passive Knee Extension with Weight  - 1 x daily - 7 x weekly - 1 reps - 5 min hold ? ?  ?ASSESSMENT: ?  ?CLINICAL IMPRESSION: ?Patient presents to PT with no current pain and reports HEP compliance as well as daily icing. He was able to do one full revolution on the bike today going backwards, but not forwards. His ROM continues to gradually improve. Patient was able to tolerate all prescribed exercises with no adverse effects. Patient continues to benefit from skilled PT services and should be progressed as able to improve functional independence. ? ?  ?GOALS: ?Goals reviewed with patient? Yes ?  ? ?LONG TERM GOALS:  ?  ?LTG Name Target Date Goal status  ?1 Pt will self report R knee pain no greater than 2/10 for improved comfort and functional ability ?Baseline: 10/10 at worst 04/30/2021 UPDATED ?INITIAL  ?2 Pt will improve R knee ROM to no less than range of 5-110 degrees post surgery for improving functional mobility ?Baseline: 20-60 degrees 04/30/2021 UPDATED ?INITIAL  ?3 Pt will increase 30 Second Sit to Stand rep count to no less than 10 reps for improved balance, strength, and functional mobility ?Baseline: 5 reps - with UE support ?Current 04/14/2021: 11 reps 04/30/2021 MET ?04/14/2021  ?4 Pt will decrease TUG time to no greater than 20 seconds with LRAD for improved balance and functional mobility ?Baseline: 65 sec with FWW 04/30/2021 UPDATED ?INITIAL  ?  ?PLAN: ?PT FREQUENCY: 2x/week ?  ?PT DURATION: 8 weeks ?  ?PLANNED INTERVENTIONS: Therapeutic exercises, Therapeutic activity, Neuro Muscular re-education, Balance training, Gait training, Patient/Family education, Joint mobilization, Stair training, Aquatic Therapy, Dry Needling,  Cryotherapy, Moist heat, Taping, and Manual therapy ? ?Check all possible CPT codes: 97110- Therapeutic Exercise, [phone]- Neuro Re-education, [phone] - Gait Training, [phone] - Manual Therapy, [phone] - Therapeutic Activities

## 2021-04-28 ENCOUNTER — Ambulatory Visit: Payer: Medicaid Other

## 2021-04-28 DIAGNOSIS — G8929 Other chronic pain: Secondary | ICD-10-CM

## 2021-04-28 DIAGNOSIS — M25561 Pain in right knee: Secondary | ICD-10-CM | POA: Diagnosis not present

## 2021-04-28 DIAGNOSIS — M25661 Stiffness of right knee, not elsewhere classified: Secondary | ICD-10-CM

## 2021-04-28 DIAGNOSIS — R2689 Other abnormalities of gait and mobility: Secondary | ICD-10-CM

## 2021-04-28 DIAGNOSIS — M6281 Muscle weakness (generalized): Secondary | ICD-10-CM

## 2021-04-30 ENCOUNTER — Ambulatory Visit: Payer: Medicaid Other

## 2021-04-30 DIAGNOSIS — R2689 Other abnormalities of gait and mobility: Secondary | ICD-10-CM

## 2021-04-30 DIAGNOSIS — G8929 Other chronic pain: Secondary | ICD-10-CM

## 2021-04-30 DIAGNOSIS — M25561 Pain in right knee: Secondary | ICD-10-CM | POA: Diagnosis not present

## 2021-04-30 NOTE — Therapy (Addendum)
OUTPATIENT PHYSICAL THERAPY TREATMENT NOTE/DISCHARGE  PHYSICAL THERAPY DISCHARGE SUMMARY  Visits from Start of Care: 23  Current functional level related to goals / functional outcomes: See goals and objective   Remaining deficits: See goals and objective   Education / Equipment: HEP   Patient agrees to discharge. Patient goals were partially met. Patient is being discharged due to not returning since the last visit.   Patient Name: Matthew Stark MRN: [id number] DOB:10/06/1960, 61 y.o., male Today's Date: 04/30/2021  [facility]P: [facility] REFERRING PROVIDER: Willaim Sheng, MD   PT End of Session - 04/30/21 1130     Visit Number 23    Number of Visits 26    Date for PT Re-Evaluation 05/29/21    Authorization Type [employer]    Authorization Time Period 12 more 04/21/2021-06/05/2021    Authorization - Visit Number 2    Authorization - Number of Visits 12    PT Start Time 1130    PT Stop Time 1208    PT Time Calculation (min) 38 min    Activity Tolerance Patient tolerated treatment well    Behavior During Therapy WFL for tasks assessed/performed                Past Medical History:  Diagnosis Date   Arthritis    Hypertension    Past Surgical History:  Procedure Laterality Date   MOUTH SURGERY     TOTAL KNEE ARTHROPLASTY Right 03/02/2021   Procedure: TOTAL KNEE ARTHROPLASTY;  Surgeon: Willaim Sheng, MD;  Location: [facility];  Service: Orthopedics;  Laterality: Right;   There are no problems to display for this patient.   REFERRING DIAG: Post OP right total knee replacement 03/02/21  THERAPY DIAG:  Chronic pain of right knee - Plan: PT plan of care cert/re-cert  Other abnormalities of gait and mobility - Plan: PT plan of care cert/re-cert  PERTINENT HISTORY: Rt knee TKA 03/02/2021  SUBJECTIVE:  Pt presents to PT with no current pain. Has been compliant with HEP with no adverse effect. Had a good f/u visit with MD this week. Pt is ready  to begin PT at this time.  Pain: Are you having pain? No NPRS scale: 0/10  Pain location: Knee Pain orientation: Right  PAIN TYPE: aching Pain description: intermittent    OBJECTIVE:  Outcomes: TUG: 65" w/ FWW 30 Second Sit to Stand: 5 reps w/ UE support 04/14/2021: 30" STS: 11 reps no UE support  LE AROM/PROM:   A/PROM Right 04/14/2021 Right 04/16/2021 Right 04/21/2021 Right 04/23/2021 Right 04/28/2021 Right 04/30/2021  Knee flexion 102 AAROM 104 105 AAROM 106 AAROM 108 AAROM 110 AAROM  Knee extension 8 (post LLLD stetch and manual) 8(Post LLD stretch and manual) 7 (post manual therapy) 7 (post manual and LLD stretch) 6 (Post manual and LLD stretch) 5(Post manual and LLD stretch)   (Blank rows = not tested)   LE MMT:   MMT Right 01/24/2021 Left 01/24/2021 Right 02/19/2021 Right  03/05/2021  Hip flexion 4+/5 5/5    Hip extension 3+/5 4/5    Hip abduction 4/5 4/5    Knee flexion 4+/5 5/5 5/5 DNT  Knee extension 3+/5 with pain 5/5 4/5 with pain DNT   (Blank rows = not tested)     TODAY'S TREATMENT: [facility] Adult PT Treatment:  DATE: 04/30/2021 Therapeutic Exercise: Recumbent bike, rocking for ROM x 5 mins - able to perform full revolution today Slant board stretch 2x30" STS 3x10 (cues to weight shift to R as he favors L) LAQ 3x10 R 4# Seated hamstring curl 3x10 BTB SLR R 4# 3x10 Supine heel slide with strap x 15 - 5" hold Supine LLLD stretch for ext, heel on 1/2 foam roll, 5# above knee x 3 mins  Manual Therapy (concentrating on increasing extensibility of restricted tissue to reduce discomfort and improve mechanics in functional movement): Manual hamstring stretch 3x45" - supine AP mobs to R knee for improving extension Grade II Modalities: Ice to R knee with R LE elevated x 8 min post session  [facility] Adult PT Treatment:                                                DATE: 04/28/2021 Therapeutic Exercise: Recumbent bike,  rocking for ROM x 5 mins - did one full revolution backwards, unable to forwards STS 3x10 (cues to weight shift to R as he favors L) LAQ 3x10 R 3# SLR R 3# 3x10 Supine heel slide with strap x 15 - 5" hold Supine LLLD stretch for ext, heel on 1/2 foam roll, 5# above knee x 5 mins  Manual Therapy (concentrating on increasing extensibility of restricted tissue to reduce discomfort and improve mechanics in functional movement): Manual hamstring stretch 3x45" - supine AP mobs to R knee for improving extension Grade II Modalities: Ice to R knee with R LE elevated x 8 min post session   [facility] Adult PT Treatment:                                                DATE: 04/23/2021 Therapeutic Exercise: Recumbent bike, rocking for ROM x 5 mins - unable to perform full revolution  STS 2x10 (cues to weight shift to R as he favors L) Standing TKE against ball on wall 5" hold 2x10 LAQ 3x10 R 3# Supine heel slide with strap x 10 - 5" hold Supine LLLD stretch for ext, heel on 1/2 foam roll, 5# above knee x 5 mins  Manual Therapy (concentrating on increasing extensibility of restricted tissue to reduce discomfort and improve mechanics in functional movement): Manual hamstring stretch 3x45" - supine  Modalities: Ice to R knee with R LE elevated x 8 min post session   PATIENT EDUCATION:  Education details: Educated on POC, prognosis, and HEP Person educated: Patient Education method: Consulting civil engineer, Media planner, and Handouts Education comprehension: verbalized understanding and returned demonstration     GAIT: Distance walked: 5f Assistive device utilized: WEnvironmental consultant- 2 wheeled Level of assistance: Modified independence Comments: step-to gait with decreased knee extension, slowed gait speed  HOME EXERCISE PROGRAM: Access Code: [email or web address] URL: [email or web address] Date: 04/21/2021 Prepared by: DOctavio Manns Exercises - Long Sitting Quad Set with Towel Roll Under Heel  - 8 x daily - 7  x weekly - 2-3 sets - 10 reps - 5 sec hold - Supine Heel Slide with Strap  - 8 x daily - 7 x weekly - 2-3 sets - 10 reps - 5-10 sec hold - Seated Heel Slide  - 5 x daily - 7 x weekly -  2-3 sets - 10 reps - 5 sec hold - Seated Hamstring Stretch  - 1 x daily - 7 x weekly - 3 sets - 10 reps - Standing Terminal Knee Extension at Wall with Ball  - 1 x daily - 7 x weekly - 3 sets - 10 reps - Standing Hamstring Stretch with Step  - 1 x daily - 7 x weekly - 3 reps - 30 sec hold - Seated Passive Knee Extension with Weight  - 1 x daily - 7 x weekly - 1 reps - 5 min hold    ASSESSMENT:   CLINICAL IMPRESSION: Pt was once again able to complete all prescribed exercises with no adverse effect or increase in pain. He continues to progress very well with therapy, showing improved functional mobility and R knee AROM. He has progressed very well in recent weeks and should be good to discharge after the next few weeks, dropping frequency to one visits per week. PT educated pt to continue focusing on HEP, especially knee extension stretching. Will continue to progress as able.     GOALS: Goals reviewed with patient? Yes    LONG TERM GOALS:    LTG Name Target Date Goal status  1 Pt will self report R knee pain no greater than 2/10 for improved comfort and functional ability Baseline: 10/10 at worst 05/29/2021 UPDATED INITIAL  2 Pt will improve R knee ROM to no less than range of 5-110 degrees post surgery for improving functional mobility Baseline: 20-60 degrees 05/29/2021 UPDATED INITIAL  3 Pt will increase 30 Second Sit to Stand rep count to no less than 10 reps for improved balance, strength, and functional mobility Baseline: 5 reps - with UE support Current 04/14/2021: 11 reps 04/30/2021 MET 04/14/2021  4 Pt will decrease TUG time to no greater than 20 seconds with LRAD for improved balance and functional mobility Baseline: 65 sec with FWW 05/29/2021 UPDATED INITIAL    PLAN: PT FREQUENCY: 2x/week    PT DURATION: 8 weeks   PLANNED INTERVENTIONS: Therapeutic exercises, Therapeutic activity, Neuro Muscular re-education, Balance training, Gait training, Patient/Family education, Joint mobilization, Stair training, Aquatic Therapy, Dry Needling, Cryotherapy, Moist heat, Taping, and Manual therapy  Check all possible CPT codes: 97110- Therapeutic Exercise, [phone]- Neuro Re-education, [phone] - Gait Training, [phone] - Manual Therapy, [id number] - Therapeutic Activities, [phone] - Self Care, and [id number] - Fredia Sorrow Melvia Matousek PT 04/30/21 12:13 PM

## 2021-11-28 ENCOUNTER — Emergency Department (HOSPITAL_COMMUNITY): Payer: Medicaid Other

## 2021-11-28 ENCOUNTER — Encounter (HOSPITAL_COMMUNITY): Payer: Self-pay

## 2021-11-28 ENCOUNTER — Other Ambulatory Visit: Payer: Self-pay

## 2021-11-28 ENCOUNTER — Emergency Department (HOSPITAL_COMMUNITY)
Admission: EM | Admit: 2021-11-28 | Discharge: 2021-11-29 | Disposition: A | Discharge status: Home / Self Care | Payer: Medicaid Other | Attending: Emergency Medicine | Admitting: Emergency Medicine

## 2021-11-28 DIAGNOSIS — M25562 Pain in left knee: Secondary | ICD-10-CM | POA: Insufficient documentation

## 2021-11-28 DIAGNOSIS — M542 Cervicalgia: Secondary | ICD-10-CM | POA: Diagnosis present

## 2021-11-28 DIAGNOSIS — M79632 Pain in left forearm: Secondary | ICD-10-CM | POA: Diagnosis not present

## 2021-11-28 DIAGNOSIS — R930 Abnormal findings on diagnostic imaging of skull and head, not elsewhere classified: Secondary | ICD-10-CM | POA: Insufficient documentation

## 2021-11-28 DIAGNOSIS — Y92410 Unspecified street and highway as the place of occurrence of the external cause: Secondary | ICD-10-CM | POA: Insufficient documentation

## 2021-11-28 DIAGNOSIS — M545 Low back pain, unspecified: Secondary | ICD-10-CM | POA: Diagnosis not present
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
## 2021-11-29 MED ORDER — IBUPROFEN 200 MG PO TABS
600.0000 mg | ORAL_TABLET | Freq: Once | ORAL | Status: AC
  Administered 2021-11-29: 600 mg via ORAL
  Filled 2021-11-29: qty 3
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
## 2022-07-29 ENCOUNTER — Other Ambulatory Visit: Payer: Self-pay | Admitting: Internal Medicine

## 2023-01-27 IMAGING — DX DG KNEE 1-2V PORT*R*
2 series · 2 of 2 positions shown · non-contrast
Comparison: Radiographs of the bilateral knees 07/08/2020 (images
available, report unavailable).

CLINICAL DATA: Provided history: Postoperative state. Postoperative
right knee replacement.

EXAM:
PORTABLE RIGHT KNEE - 1-2 VIEW

[knee ap]
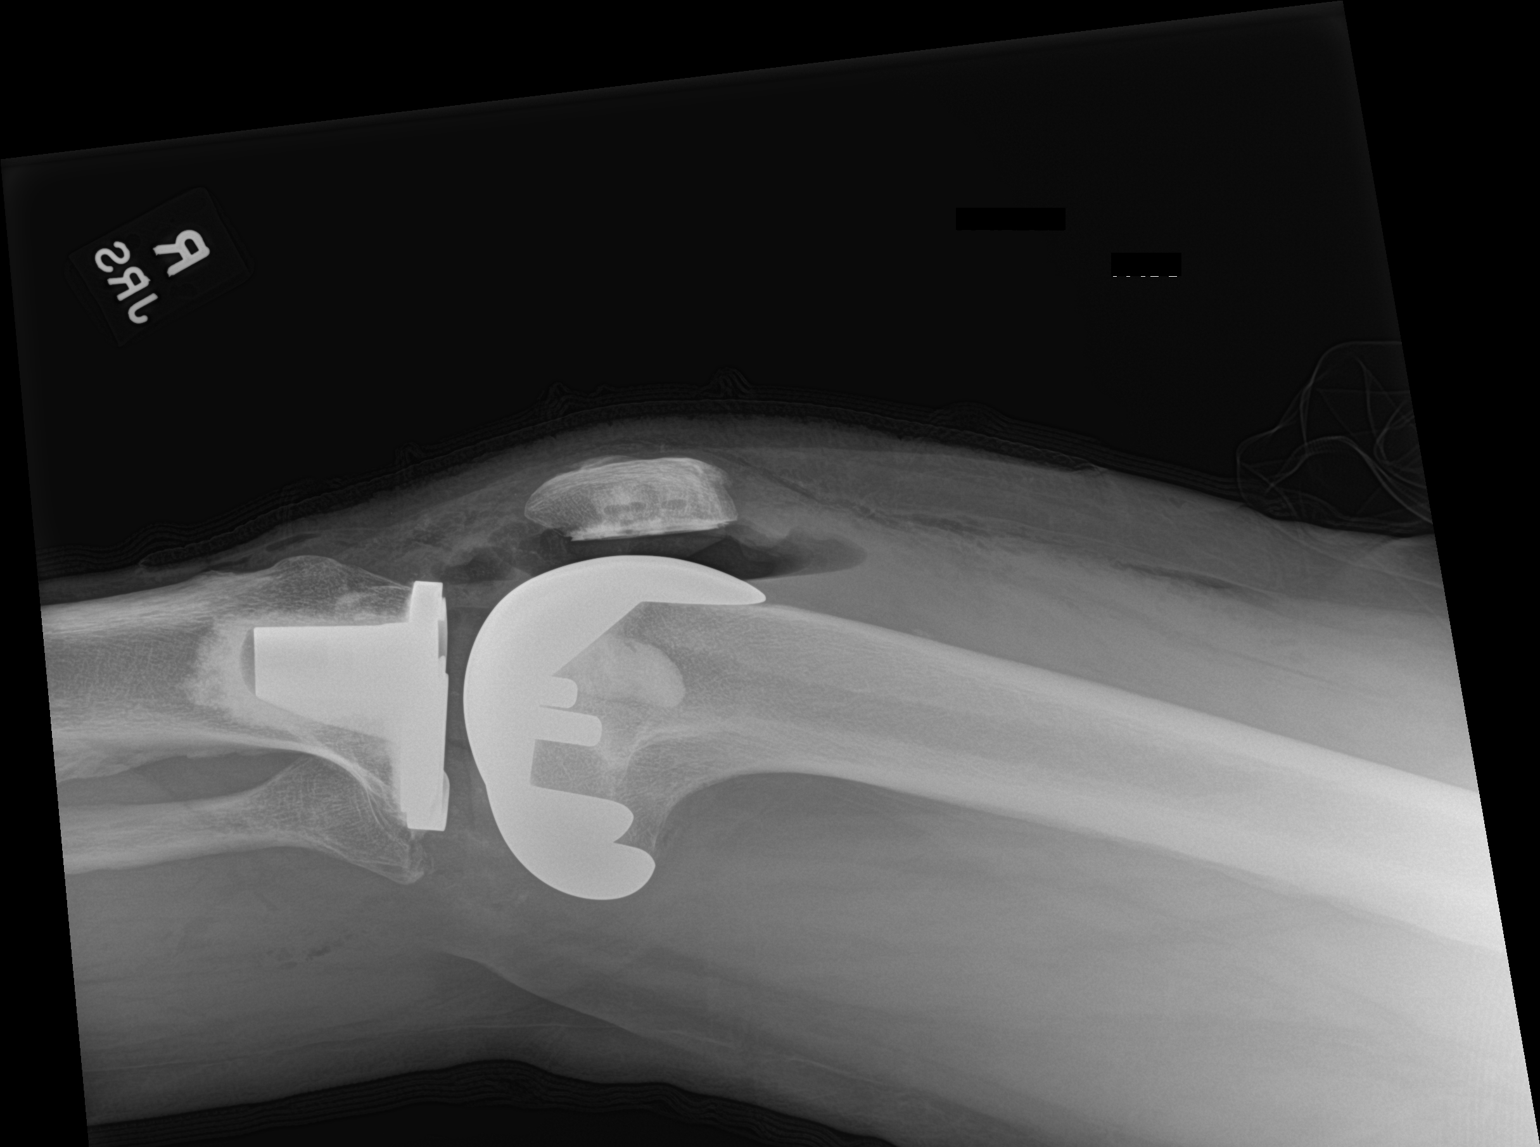

[knee lat]
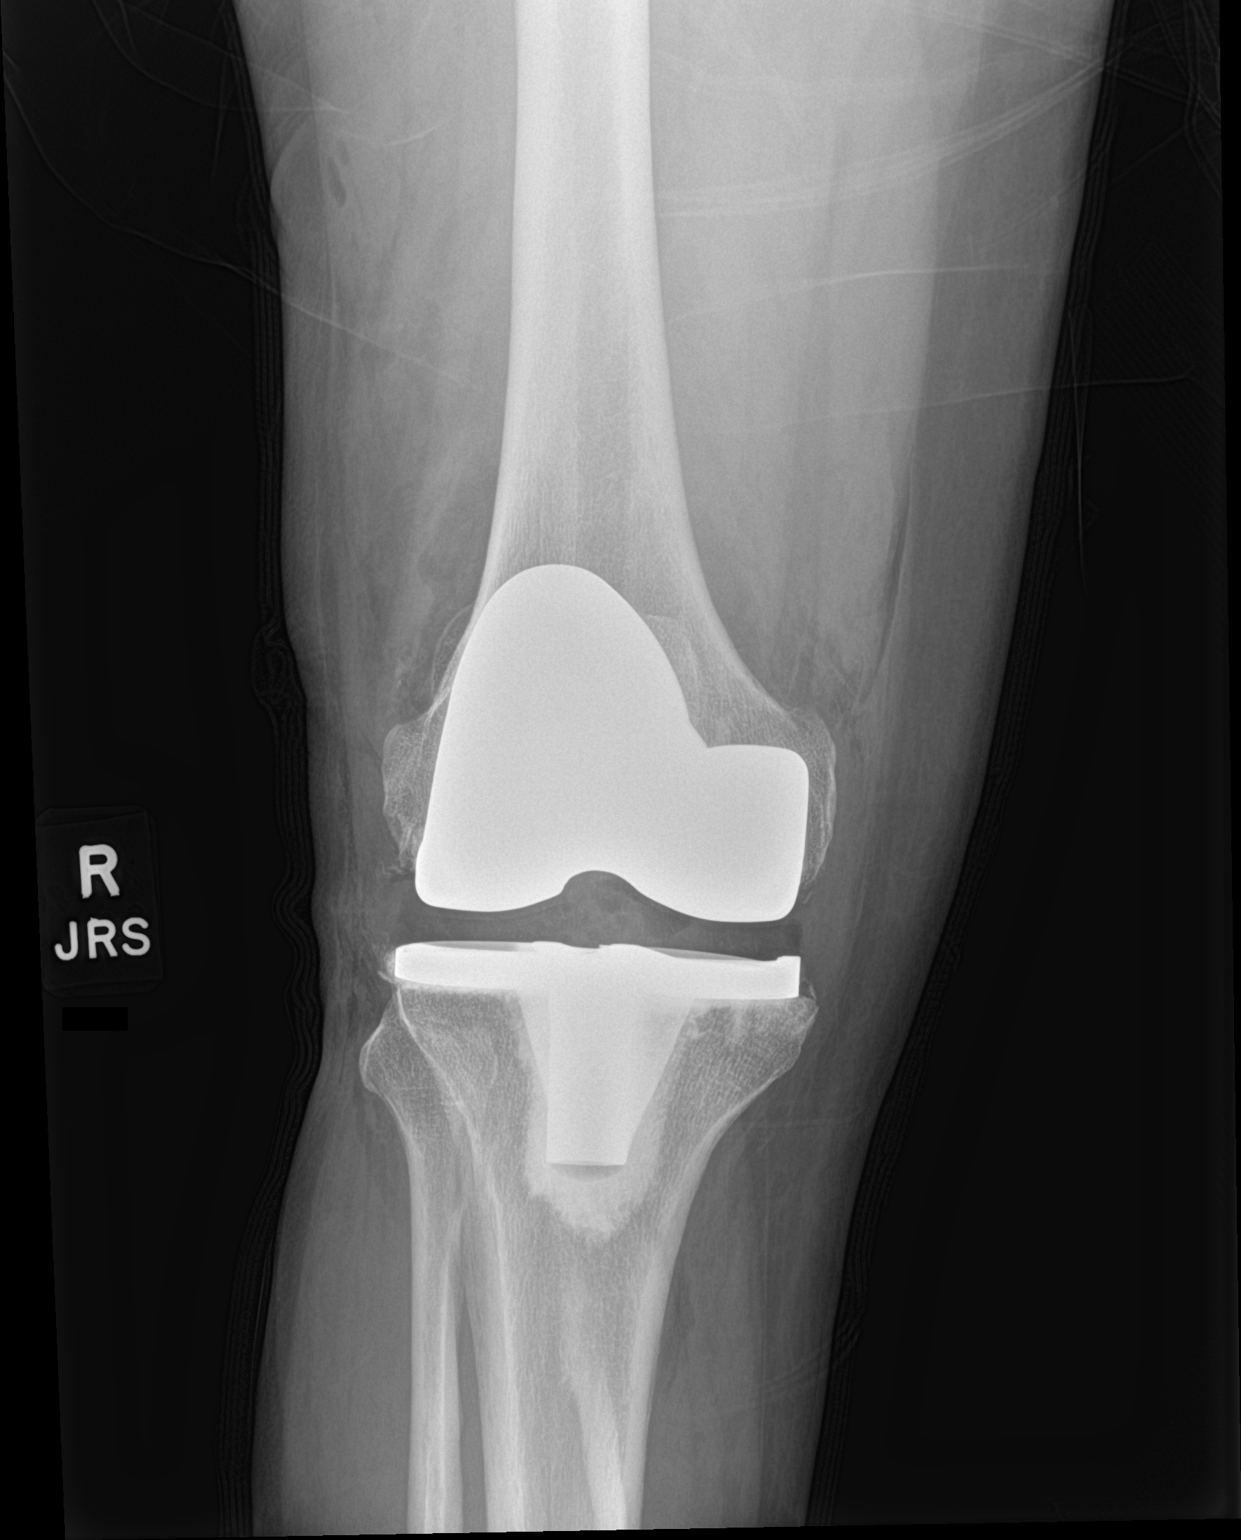

[2 of 2 positions shown; findings below may reference images not displayed]

FINDINGS: Immediate postoperative changes from right total knee arthroplasty.
The femoral and tibial components appear well-seated at this time.
Small amount of gas within the joint space and subcutaneous soft
tissues, not unexpected in the immediate postoperative period. Soft
tissue swelling about the knee, also not unexpected.
IMPRESSION: Immediate postoperative changes from right total knee arthroplasty,
as described.
